# Patient Record
Sex: Female | Born: 1935 | Race: White | Hispanic: No | Smoking: Former smoker
Health system: Southern US, Community
[De-identification: ages and names within clinical notes are randomized; demographics above are authoritative.]

## PROBLEM LIST (undated history)

## (undated) DIAGNOSIS — F329 Major depressive disorder, single episode, unspecified: Secondary | ICD-10-CM

## (undated) DIAGNOSIS — F32A Depression, unspecified: Secondary | ICD-10-CM

## (undated) DIAGNOSIS — R011 Cardiac murmur, unspecified: Secondary | ICD-10-CM

## (undated) HISTORY — PX: ABDOMINAL HYSTERECTOMY: SHX81

## (undated) HISTORY — DX: Major depressive disorder, single episode, unspecified: F32.9

## (undated) HISTORY — DX: Cardiac murmur, unspecified: R01.1

## (undated) HISTORY — DX: Depression, unspecified: F32.A

## (undated) HISTORY — PX: APPENDECTOMY: SHX54

---

## 2009-01-18 ENCOUNTER — Encounter: Admission: RE | Admit: 2009-01-18 | Discharge: 2009-01-18 | Payer: Self-pay | Admitting: General Practice

## 2011-03-12 ENCOUNTER — Encounter: Payer: Self-pay | Admitting: Family Medicine

## 2013-04-18 ENCOUNTER — Ambulatory Visit: Payer: Medicare Other

## 2013-04-18 ENCOUNTER — Ambulatory Visit (INDEPENDENT_AMBULATORY_CARE_PROVIDER_SITE_OTHER): Payer: Medicare Other | Admitting: Family Medicine

## 2013-04-18 VITALS — BP 158/88 | HR 103 | Temp 99.0°F | Resp 16 | Ht 63.0 in | Wt 218.0 lb

## 2013-04-18 DIAGNOSIS — M25519 Pain in unspecified shoulder: Secondary | ICD-10-CM

## 2013-04-18 DIAGNOSIS — M19011 Primary osteoarthritis, right shoulder: Secondary | ICD-10-CM

## 2013-04-18 DIAGNOSIS — M19019 Primary osteoarthritis, unspecified shoulder: Secondary | ICD-10-CM

## 2013-04-18 DIAGNOSIS — M25511 Pain in right shoulder: Secondary | ICD-10-CM

## 2013-04-18 MED ORDER — DICLOFENAC SODIUM 50 MG PO TBEC: 50.0000 mg | DELAYED_RELEASE_TABLET | Freq: Two times a day (BID) | ORAL | Status: DC

## 2013-04-18 NOTE — Progress Notes (Signed)
Subjective: 79 year old lady who lives here in [place] with her son. She moved here about 5 years ago from [place]. She used to play the piano and other musical instruments. She has been hurting in her right shoulder for a couple of years, but more recently. Over the last couple of months she has been taking a couple of aspirin for it. Today he just seemed like a convenient time for them to come in and get it checked, nothing acutely worse today but it has just been progressively worse.  Objective: Pleasant elderly lady in no major distress. Range of motion of the right shoulder is a little diminished as compared to the left. She is left-handed. She has very adequate strength and get her grip in both hands. Sensory grossly normal. Neck has satisfactory range of motion and does not seem to be a source of her complaints and problems.  Assessment: Right shoulder pain  Plan: X-ray right shoulder  [facility] reading (PRIMARY) by  Dr. Alwyn Ren djd shoulder  Discussed cortisone and nsaids and referral to ortho.  Decided to try nsaid rx for 10 days.  If symptoms persist return for injection.  Stop meds if any side effects.Marland Kitchen

## 2013-04-18 NOTE — Patient Instructions (Signed)
Take the diclofenac one twice daily with food. Do not exceed 10 days. Stop immediately if it causes stomach pain or ankle swelling  If not improving or pains or continued to persist over the next month return and I can inject some cortisone.  Return sooner if concerned

## 2013-05-04 ENCOUNTER — Ambulatory Visit: Payer: Medicare Other | Admitting: Family Medicine

## 2013-05-18 ENCOUNTER — Ambulatory Visit (INDEPENDENT_AMBULATORY_CARE_PROVIDER_SITE_OTHER): Payer: Medicare Other | Admitting: Family Medicine

## 2013-05-18 ENCOUNTER — Encounter: Payer: Self-pay | Admitting: Family Medicine

## 2013-05-18 VITALS — BP 160/90 | HR 78 | Temp 97.6°F | Resp 18 | Ht 61.0 in | Wt 213.0 lb

## 2013-05-18 DIAGNOSIS — I10 Essential (primary) hypertension: Secondary | ICD-10-CM

## 2013-05-18 DIAGNOSIS — F0390 Unspecified dementia without behavioral disturbance: Secondary | ICD-10-CM | POA: Insufficient documentation

## 2013-05-18 DIAGNOSIS — M19011 Primary osteoarthritis, right shoulder: Secondary | ICD-10-CM

## 2013-05-18 DIAGNOSIS — E119 Type 2 diabetes mellitus without complications: Secondary | ICD-10-CM

## 2013-05-18 DIAGNOSIS — Z0000 Encounter for general adult medical examination without abnormal findings: Secondary | ICD-10-CM

## 2013-05-18 DIAGNOSIS — L989 Disorder of the skin and subcutaneous tissue, unspecified: Secondary | ICD-10-CM

## 2013-05-18 DIAGNOSIS — E785 Hyperlipidemia, unspecified: Secondary | ICD-10-CM

## 2013-05-18 LAB — COMPREHENSIVE METABOLIC PANEL
ALK PHOS: 92 U/L (ref 39–117)
ALT: 17 U/L (ref 0–35)
AST: 19 U/L (ref 0–37)
Albumin: 4.4 g/dL (ref 3.5–5.2)
BILIRUBIN TOTAL: 0.7 mg/dL (ref 0.3–1.2)
BUN: 12 mg/dL (ref 6–23)
CO2: 25 mEq/L (ref 19–32)
Calcium: 10.1 mg/dL (ref 8.4–10.5)
Chloride: 101 mEq/L (ref 96–112)
Creat: 0.61 mg/dL (ref 0.50–1.10)
Glucose, Bld: 137 mg/dL — ABNORMAL HIGH (ref 70–99)
Potassium: 4 mEq/L (ref 3.5–5.3)
SODIUM: 136 meq/L (ref 135–145)
TOTAL PROTEIN: 7.6 g/dL (ref 6.0–8.3)

## 2013-05-18 LAB — CBC
HCT: 43.3 % (ref 36.0–46.0)
Hemoglobin: 14.8 g/dL (ref 12.0–15.0)
MCH: 30.6 pg (ref 26.0–34.0)
MCHC: 34.2 g/dL (ref 30.0–36.0)
MCV: 89.5 fL (ref 78.0–100.0)
PLATELETS: 175 10*3/uL (ref 150–400)
RBC: 4.84 MIL/uL (ref 3.87–5.11)
RDW: 13.7 % (ref 11.5–15.5)
WBC: 9.8 10*3/uL (ref 4.0–10.5)

## 2013-05-18 LAB — TSH: TSH: 1.326 u[IU]/mL (ref 0.350–4.500)

## 2013-05-18 LAB — LIPID PANEL
CHOL/HDL RATIO: 4.2 ratio
Cholesterol: 249 mg/dL — ABNORMAL HIGH (ref 0–200)
HDL: 59 mg/dL (ref >39)
LDL Cholesterol: 164 mg/dL — ABNORMAL HIGH (ref 0–99)
Triglycerides: 131 mg/dL (ref <150)
VLDL: 26 mg/dL (ref 0–40)

## 2013-05-18 MED ORDER — AMLODIPINE BESYLATE 2.5 MG PO TABS: 2.5000 mg | ORAL_TABLET | Freq: Every day | ORAL | Status: DC

## 2013-05-18 NOTE — Patient Instructions (Signed)
Start taking the amlodipine 2.5 mg once a day. If you can, please check your BP at home a couple of times a week and give me an update in a few weeks.   I will be in touch regarding your labs- please set up your mychart account if you can.   You might want to consider having a mammogram in the next 6 months or so.  You might try the [facility]  Address: [address] [place], [place] [address]  [phone]  If we could, it would be helpful to get your old medical records so I can make sure you have had all of your immunizations (pneumonia, shingles, etc).   After bathing you should carefully dry under your breasts and under your belly.  Try using some powder to keep this area dry.  If you do decide you would like to have someone help you bathe at home I am glad to help arrange this.   I will arrange a dermatology appt to look at the area on your leg.

## 2013-05-18 NOTE — Progress Notes (Addendum)
[facility] [address] [place] [address] [phone]  Date:  05/18/2013   Name:  Anne Rice   DOB:  1924/10/14   MRN:  [id number]  PCP:  No primary provider on file.    Chief Complaint: Annual Exam   History of Present Illness:  Anne Rice is a 80 y.o. very pleasant female patient who presents with the following:  Here today for a CPE.  Seen here once in December for shoulder pain.  She has been noted to have elevated BP the last time she was seen here.   She is from [place], but now lives here wiht her son Anne Rice.  Moved in 2012  Her most recent mammogram was in 2010.   History of recurrent cerumen impaction.  She has been taking diclofenac for her shoulder arthritis; this helps somewhat She is fasting today for labs.   She has noted a skin lesion on her right shin for about 4 years.  Admits that she does tend to pick at it, but it is not aware of it getting bigger. This has not been bx.   She had been getting regular medical care prior to her move but since then has not been seeing a doctor regularly   Her son is aware that she is demented but has not noted any significant change in recent months.  He does not yet feel that her memory problems are causing any problems for her care at home an does not yet have any in- home assistance for her.    There are no active problems to display for this patient.   Past Medical History  Diagnosis Date  . Depression     Past Surgical History  Procedure Laterality Date  . Appendectomy    . Abdominal hysterectomy      History  Substance Use Topics  . Smoking status: Never Smoker   . Smokeless tobacco: Not on file  . Alcohol Use: No    No family history on file.  No Known Allergies  Medication list has been reviewed and updated.  Current Outpatient Prescriptions on File Prior to Visit  Medication Sig Dispense Refill  . diclofenac (VOLTAREN) 50 MG EC tablet Take 1 tablet (50 mg total) by  mouth 2 (two) times daily. Take with food.  20 tablet  0  . aspirin EC 81 MG tablet Take 81 mg by mouth daily. Pt taking 2 tablets daily       No current facility-administered medications on file prior to visit.    Review of Systems:  As per HPI- otherwise negative.   Physical Examination: Filed Vitals:   05/18/13 0940  BP: 172/90  Pulse: 78  Temp: 97.6 F (36.4 C)  Resp: 18   Filed Vitals:   05/18/13 0940  Height: 5\' 1"  (1.549 m)  Weight: 213 lb (96.616 kg)   Body mass index is 40.27 kg/(m^2). Ideal Body Weight: Weight in (lb) to have BMI = 25: 132  GEN: WDWN, NAD, Non-toxic, Alert and pleasant.  Tangential speech with multiple off topic stories and comments.  Pleasant.  HEENT: Atraumatic, Normocephalic. Neck supple. No masses, No LAD. Ears and Nose: No external deformity. CV: RRR, No M/G/R. No JVD. No thrill. No extra heart sounds. PULM: CTA B, no wheezes, crackles, rhonchi. No retractions. No resp. distress. No accessory muscle use. ABD: S, NT, ND, +BS. No rebound. No HSM. EXTR: No c/c/e NEURO Normal gait.  PSYCH: Normally interactive. Conversant. Not  depressed or anxious appearing.  Calm demeanor.  Right shin: there is a 2 x 2.5 cm lesion on the right shin.  This appears ulcerated and chronically picked.   Breast exam: no apparent mass.  She does have evidence of yeast infection/ foul odor from skin folds under her breasts and pannus, and urine odor in her groin area   Assessment and Plan: Physical exam - Plan: CBC, Comprehensive metabolic panel, TSH, Lipid panel  Skin lesion of right leg - Plan: Ambulatory referral to Dermatology  HTN (hypertension) - Plan: amLODipine (NORVASC) 2.5 MG tablet  Check labs as above.   Discussed with pt and her son.  As of now she declines any bathing help.  Encouraged a shower at least every 2 or 3 days, pat dry skin and use powder to prevent dampness  Start amlodipine 2.5 mg once a day for her BP Referral to derm for her skin  lesion Dementia: thus far not causing problems at home.  Her son is aware but not bothered by this so far.    Signed Lamar Blinks, MD  11/27 Called and discussed with her son Anne Rice.  Unfortunately it does look like Anne Rice has mild DM.  Placed an order for an A1c for her; they will come by and do this as a lab visit only soon.  Also she does have high cholesterol.  Encouraged them to look at the ADA website for lots of good information. They will work on diet and he will try to encourage her to exercise as much as is possible for her.   Cholesterol is also high especially in light of her diabetes.  Will start pravachol 10 mg daily  She would like a rf of her voltaren as well; will do this for her.  Encouraged caution with this medication due to risk of stomach irritation

## 2013-05-19 ENCOUNTER — Encounter: Payer: Self-pay | Admitting: Family Medicine

## 2013-05-19 MED ORDER — PRAVASTATIN SODIUM 10 MG PO TABS: 10.0000 mg | ORAL_TABLET | Freq: Every day | ORAL | Status: DC

## 2013-05-19 MED ORDER — DICLOFENAC SODIUM 50 MG PO TBEC: 50.0000 mg | DELAYED_RELEASE_TABLET | Freq: Two times a day (BID) | ORAL | Status: DC

## 2013-05-19 NOTE — Addendum Note (Signed)
Addended by: Darreld Mclean on: 05/19/2013 08:33 PM   Modules accepted: Orders

## 2013-05-20 ENCOUNTER — Telehealth: Payer: Self-pay | Admitting: Family Medicine

## 2013-05-20 ENCOUNTER — Other Ambulatory Visit (INDEPENDENT_AMBULATORY_CARE_PROVIDER_SITE_OTHER): Payer: Medicare Other | Admitting: *Deleted

## 2013-05-20 DIAGNOSIS — E119 Type 2 diabetes mellitus without complications: Secondary | ICD-10-CM

## 2013-05-20 LAB — POCT GLYCOSYLATED HEMOGLOBIN (HGB A1C): HEMOGLOBIN A1C: 6

## 2013-05-20 NOTE — Telephone Encounter (Signed)
Called her son and discussed her A1c.  It is 6% which is in the pre- diabetic range.  At this time we will continue working on diet and exercise.  Check back in 3 months or so

## 2013-09-04 ENCOUNTER — Encounter: Payer: Self-pay | Admitting: Family Medicine

## 2013-09-04 DIAGNOSIS — C4491 Basal cell carcinoma of skin, unspecified: Secondary | ICD-10-CM | POA: Insufficient documentation

## 2013-09-30 ENCOUNTER — Other Ambulatory Visit: Payer: Self-pay | Admitting: Family Medicine

## 2013-10-13 ENCOUNTER — Other Ambulatory Visit: Payer: Self-pay | Admitting: Family Medicine

## 2013-10-24 ENCOUNTER — Other Ambulatory Visit: Payer: Self-pay | Admitting: Family Medicine

## 2013-12-05 ENCOUNTER — Other Ambulatory Visit: Payer: Self-pay | Admitting: Physician Assistant

## 2013-12-05 ENCOUNTER — Other Ambulatory Visit: Payer: Self-pay | Admitting: Family Medicine

## 2013-12-19 ENCOUNTER — Other Ambulatory Visit: Payer: Self-pay | Admitting: Physician Assistant

## 2013-12-29 ENCOUNTER — Telehealth: Payer: Self-pay

## 2013-12-29 DIAGNOSIS — E785 Hyperlipidemia, unspecified: Secondary | ICD-10-CM

## 2013-12-29 MED ORDER — AMLODIPINE BESYLATE 2.5 MG PO TABS: 2.5000 mg | ORAL_TABLET | Freq: Every day | ORAL | Status: DC

## 2013-12-29 MED ORDER — PRAVASTATIN SODIUM 10 MG PO TABS: 10.0000 mg | ORAL_TABLET | Freq: Every day | ORAL | Status: DC

## 2013-12-29 MED ORDER — DICLOFENAC SODIUM 50 MG PO TBEC: 50.0000 mg | DELAYED_RELEASE_TABLET | Freq: Every day | ORAL | Status: DC

## 2013-12-29 NOTE — Telephone Encounter (Signed)
Pt has called and left message over wknd needing her medicine that has ran out.   I have left a message to call and make an appt.  She wants to know if she can get a refill on   Amlodipine (Norvasc) Diclofenac (Voltaren)   PT # [phone]

## 2013-12-29 NOTE — Telephone Encounter (Signed)
Called son back and discussed what appt is needed for, and also explained why we send messages on refills first and then would follow up with a phone call if pt does not get in for OV. Discussed MyChart and it being a more direct form of communication with the provider if needed. Son stated he will sign up after pt's appt. I advised I will send in 1 mos of RFs on all three meds pt needs to hold her over until appt. Done.

## 2013-12-29 NOTE — Telephone Encounter (Signed)
Pt has received 2 notifications she needs an OV- Please advise refills

## 2014-01-11 ENCOUNTER — Encounter: Payer: Self-pay | Admitting: Family Medicine

## 2014-01-11 ENCOUNTER — Other Ambulatory Visit: Payer: Self-pay | Admitting: Family Medicine

## 2014-01-11 ENCOUNTER — Ambulatory Visit (INDEPENDENT_AMBULATORY_CARE_PROVIDER_SITE_OTHER): Payer: Medicare Other | Admitting: Family Medicine

## 2014-01-11 VITALS — BP 142/90 | HR 93 | Temp 97.9°F | Resp 16 | Ht 62.0 in | Wt 210.0 lb

## 2014-01-11 DIAGNOSIS — Z23 Encounter for immunization: Secondary | ICD-10-CM

## 2014-01-11 DIAGNOSIS — M19011 Primary osteoarthritis, right shoulder: Secondary | ICD-10-CM

## 2014-01-11 DIAGNOSIS — E785 Hyperlipidemia, unspecified: Secondary | ICD-10-CM

## 2014-01-11 DIAGNOSIS — I10 Essential (primary) hypertension: Secondary | ICD-10-CM

## 2014-01-11 DIAGNOSIS — Z1239 Encounter for other screening for malignant neoplasm of breast: Secondary | ICD-10-CM

## 2014-01-11 DIAGNOSIS — E669 Obesity, unspecified: Secondary | ICD-10-CM

## 2014-01-11 DIAGNOSIS — M19019 Primary osteoarthritis, unspecified shoulder: Secondary | ICD-10-CM

## 2014-01-11 DIAGNOSIS — E119 Type 2 diabetes mellitus without complications: Secondary | ICD-10-CM

## 2014-01-11 LAB — CBC
HCT: 42.7 % (ref 36.0–46.0)
Hemoglobin: 14.2 g/dL (ref 12.0–15.0)
MCH: 30.5 pg (ref 26.0–34.0)
MCHC: 33.3 g/dL (ref 30.0–36.0)
MCV: 91.6 fL (ref 78.0–100.0)
PLATELETS: 185 10*3/uL (ref 150–400)
RBC: 4.66 MIL/uL (ref 3.87–5.11)
RDW: 13.8 % (ref 11.5–15.5)
WBC: 10.5 10*3/uL (ref 4.0–10.5)

## 2014-01-11 NOTE — Progress Notes (Signed)
[facility] [address] [place] [address] [phone]  Date:  01/11/2014   Name:  Anne Rice   DOB:  April 01, 1925   MRN:  [id number]  PCP:  No primary provider on file.    Chief Complaint: bloodwork and Medication Refill   History of Present Illness:  Anne Rice is a 80 y.o. very pleasant female patient who presents with the following:  Here today to follow-up. Seen in January and noted to have diabetes by her FBG of 137, A1c looked good.  She is doing well today.  She is fasting this am.  Here today with her son Flu shot today She saw Dr. Earley Brooke for the basal cell on her leg and this is doing well.  She had this excised  She has had some trouble with her right arm for about a year or so- she was noted to have significant OA in the shoulder on plain film a few months ago.  The shoulder does give her some pain, and interfered with her ADLs  Lab Results  Component Value Date   HGBA1C 6.0 05/20/2013     Patient Active Problem List   Diagnosis Date Noted  . Basal cell carcinoma of skin 09/04/2013  . HTN (hypertension) 05/18/2013  . Dementia 05/18/2013    Past Medical History  Diagnosis Date  . Depression     Past Surgical History  Procedure Laterality Date  . Appendectomy    . Abdominal hysterectomy      History  Substance Use Topics  . Smoking status: Never Smoker   . Smokeless tobacco: Not on file  . Alcohol Use: No    No family history on file.  No Known Allergies  Medication list has been reviewed and updated.  Current Outpatient Prescriptions on File Prior to Visit  Medication Sig Dispense Refill  . amLODipine (NORVASC) 2.5 MG tablet Take 1 tablet (2.5 mg total) by mouth daily.  30 tablet  0  . aspirin EC 81 MG tablet Take 81 mg by mouth daily. Pt taking 2 tablets daily      . diclofenac (VOLTAREN) 50 MG EC tablet Take 1 tablet (50 mg total) by mouth daily.  30 tablet  0  . pravastatin (PRAVACHOL) 10 MG tablet Take  1 tablet (10 mg total) by mouth daily.  30 tablet  0   No current facility-administered medications on file prior to visit.    Review of Systems:  As per HPI- otherwise negative.   Physical Examination: Filed Vitals:   01/11/14 1129  BP: 164/90  Pulse: 93  Temp: 97.9 F (36.6 C)  Resp: 16   Filed Vitals:   01/11/14 1129  Height: 5\' 2"  (1.575 m)  Weight: 210 lb (95.255 kg)   Body mass index is 38.4 kg/(m^2). Ideal Body Weight: Weight in (lb) to have BMI = 25: 136.4  GEN: WDWN, NAD, Non-toxic, A & O x 3, obese, looks well HEENT: Atraumatic, Normocephalic. Neck supple. No masses, No LAD. Ears and Nose: No external deformity. CV: RRR, No M/G/R. No JVD. No thrill. No extra heart sounds. PULM: CTA B, no wheezes, crackles, rhonchi. No retractions. No resp. distress. No accessory muscle use. ABD: S, NT, ND EXTR: No c/c/e NEURO Normal gait.  PSYCH: Normally interactive. Conversant. Not depressed or anxious appearing.  Calm demeanor.  Right shoulder: she has fairly good ROM but pain with internal rotation, and not quite full extension or abduction.     Assessment  and Plan: Immunization due - Plan: Pneumococcal conjugate vaccine 13-valent IM, Flu Vaccine QUAD 36+ mos IM  Type II or unspecified type diabetes mellitus without mention of complication, not stated as uncontrolled - Plan: Hemoglobin A1c  Other and unspecified hyperlipidemia - Plan: Lipid panel  Essential hypertension - Plan: CBC, Comprehensive metabolic panel  Primary osteoarthritis of right shoulder - Plan: Ambulatory referral to Orthopedic Surgery  Breast cancer screening - Plan: MM Digital Screening  prevnar and flu shots today Await other labs.  She would like to see ortho for her shoulder- given CD of her films and will arrange appt for her Referral for mammogram as well HTN is under reasonable control   Signed Lamar Blinks, MD

## 2014-01-11 NOTE — Patient Instructions (Signed)
Good to see you today!  I will set up a referral for you to see an orthopedist about your shoulder and also will arrange a mammogram for you.  Otherwise I will be in touch with your labs asap

## 2014-01-12 LAB — COMPREHENSIVE METABOLIC PANEL
ALK PHOS: 74 U/L (ref 39–117)
ALT: 25 U/L (ref 0–35)
AST: 23 U/L (ref 0–37)
Albumin: 4.3 g/dL (ref 3.5–5.2)
BUN: 20 mg/dL (ref 6–23)
CALCIUM: 9.9 mg/dL (ref 8.4–10.5)
CHLORIDE: 102 meq/L (ref 96–112)
CO2: 25 mEq/L (ref 19–32)
CREATININE: 0.78 mg/dL (ref 0.50–1.10)
Glucose, Bld: 114 mg/dL — ABNORMAL HIGH (ref 70–99)
POTASSIUM: 4.8 meq/L (ref 3.5–5.3)
Sodium: 137 mEq/L (ref 135–145)
Total Bilirubin: 0.7 mg/dL (ref 0.2–1.2)
Total Protein: 7.2 g/dL (ref 6.0–8.3)

## 2014-01-12 LAB — HEMOGLOBIN A1C
HEMOGLOBIN A1C: 6.8 % — AB (ref <5.7)
MEAN PLASMA GLUCOSE: 148 mg/dL — AB (ref <117)

## 2014-01-12 LAB — LIPID PANEL
CHOL/HDL RATIO: 3.8 ratio
Cholesterol: 229 mg/dL — ABNORMAL HIGH (ref 0–200)
HDL: 61 mg/dL (ref >39)
LDL CALC: 133 mg/dL — AB (ref 0–99)
Triglycerides: 175 mg/dL — ABNORMAL HIGH (ref <150)
VLDL: 35 mg/dL (ref 0–40)

## 2014-01-13 ENCOUNTER — Encounter: Payer: Self-pay | Admitting: Family Medicine

## 2014-01-21 ENCOUNTER — Ambulatory Visit
Admission: RE | Admit: 2014-01-21 | Discharge: 2014-01-21 | Disposition: A | Discharge status: Home / Self Care | Payer: Medicare Other | Source: Ambulatory Visit | Attending: Family Medicine | Admitting: Family Medicine

## 2014-01-21 DIAGNOSIS — Z1239 Encounter for other screening for malignant neoplasm of breast: Secondary | ICD-10-CM

## 2014-01-27 ENCOUNTER — Other Ambulatory Visit: Payer: Self-pay | Admitting: Family Medicine

## 2014-01-27 DIAGNOSIS — E785 Hyperlipidemia, unspecified: Secondary | ICD-10-CM

## 2014-01-27 MED ORDER — PRAVASTATIN SODIUM 10 MG PO TABS: 10.0000 mg | ORAL_TABLET | Freq: Every day | ORAL | Status: DC

## 2014-01-27 NOTE — Telephone Encounter (Signed)
Pt's son called checking status of RFs he thought were going to be sent in at 01/11/14 OV. Reviewed OV notes and sent in RFs.

## 2014-03-23 ENCOUNTER — Telehealth: Payer: Self-pay

## 2014-03-23 NOTE — Telephone Encounter (Signed)
Pt needs to come into the office- transferred son to scheduling.

## 2014-03-23 NOTE — Telephone Encounter (Signed)
Pts son states mom needs flexibility to take more than one of her Diclofenac,please increase rx???   Best phone for son is [phone]   Pharmacy [address]

## 2014-04-19 ENCOUNTER — Ambulatory Visit (INDEPENDENT_AMBULATORY_CARE_PROVIDER_SITE_OTHER): Payer: Medicare Other | Admitting: Family Medicine

## 2014-04-19 ENCOUNTER — Encounter: Payer: Self-pay | Admitting: Family Medicine

## 2014-04-19 VITALS — BP 146/84 | HR 94 | Temp 98.0°F | Resp 16 | Ht 62.0 in | Wt 208.0 lb

## 2014-04-19 DIAGNOSIS — M19011 Primary osteoarthritis, right shoulder: Secondary | ICD-10-CM

## 2014-04-19 DIAGNOSIS — R739 Hyperglycemia, unspecified: Secondary | ICD-10-CM

## 2014-04-19 LAB — MICROALBUMIN, URINE: MICROALB UR: 0.5 mg/dL (ref <2.0)

## 2014-04-19 LAB — POCT GLYCOSYLATED HEMOGLOBIN (HGB A1C): HEMOGLOBIN A1C: 6.3

## 2014-04-19 LAB — GLUCOSE, POCT (MANUAL RESULT ENTRY): POC Glucose: 130 mg/dl — AB (ref 70–99)

## 2014-04-19 MED ORDER — DICLOFENAC SODIUM 1 % TD GEL: 2.0000 g | Freq: Four times a day (QID) | TRANSDERMAL | Status: DC

## 2014-04-19 NOTE — Progress Notes (Signed)
Examined pt with Lindaann Slough, PA-C and agree.  They have been to see Dr. Tamera Punt, a shoulder specialist a few months ago.  Per son's report they discussed surgery/? replacement but they were hesitant to pursue surgery at that time.  Uncertain if they talked about possible injection.  I will give Dr. Tamera Punt a call and clarify if he thinks an injection will be helpful.  Hold off an another referral while I get in touch with him

## 2014-04-19 NOTE — Progress Notes (Signed)
   Subjective:    Patient ID: Anne Rice, female    DOB: 1925-01-22, 80 y.o.   MRN: [id number]  HPI Patient presents with son for adjustment of diclofenac for right shoulder pain that has been present for over a year. Pain is not worse, but is constant. Takes medication in the morning and pain returns within hours. Pain sometimes radiates to neck or arm, but currently. Some loss of function due to pain, however, no numbness of fingers or hands. Not interested in PT for mother at this time.  BP elevated in office. Son states that [facility] is usually in the 140s, but is always increased when in office.    Review of Systems  Constitutional: Positive for activity change.  Respiratory: Negative for shortness of breath.   Cardiovascular: Negative for chest pain and palpitations.  Musculoskeletal: Positive for myalgias and arthralgias. Negative for back pain, joint swelling, gait problem, neck pain and neck stiffness.  Neurological: Negative for light-headedness and headaches.       Objective:   Physical Exam  Constitutional: She appears well-developed and well-nourished. No distress.  Blood pressure 163/82, pulse 94, temperature 98 F (36.7 C), temperature source Oral, resp. rate 16, height 5\' 2"  (1.575 m), weight 208 lb (94.348 kg), SpO2 95 %. BP recheck-146/84  HENT:  Head: Normocephalic and atraumatic.  Right Ear: External ear normal.  Left Ear: External ear normal.  Eyes: Conjunctivae are normal. Pupils are equal, round, and reactive to light. Right eye exhibits no discharge. Left eye exhibits no discharge. No scleral icterus.  Cardiovascular: Normal rate, regular rhythm and normal heart sounds.   Pulmonary/Chest: Effort normal and breath sounds normal. She has no wheezes. She has no rales.  Musculoskeletal: She exhibits no edema or tenderness.       Right shoulder: She exhibits decreased range of motion, pain and decreased strength. She exhibits no tenderness, no  swelling, no effusion, no deformity and no spasm.       Right elbow: Normal.      Right wrist: Normal.  Neurological: She is alert. She has normal reflexes. She displays no atrophy. No sensory deficit. She exhibits normal muscle tone. Coordination abnormal.  Limited to patient's listening deficits.  Skin: Skin is warm and dry. No rash noted. She is not diaphoretic. No erythema. No pallor.   Results for orders placed or performed in visit on 04/19/14  POCT glucose (manual entry)  Result Value Ref Range   POC Glucose 130 (A) 70 - 99 mg/dl  POCT glycosylated hemoglobin (Hb A1C)  Result Value Ref Range   Hemoglobin A1C 6.3        Assessment & Plan:  1. Hyperglycemia Controlled with diet. HA1C within range. - Microalbumin, urine - POCT glucose (manual entry) - POCT glycosylated hemoglobin (Hb A1C)  2. Primary osteoarthritis of right shoulder - Continue diclofenac 50 mg po.  - diclofenac sodium (VOLTAREN) 1 % GEL; Apply 2 g topically 4 (four) times daily.  Dispense: 1 Tube; Refill: 5 - Ambulatory referral to [place] PA-C  [facility] 04/19/2014 9:02 AM

## 2014-04-20 ENCOUNTER — Telehealth: Payer: Self-pay

## 2014-04-20 NOTE — Telephone Encounter (Signed)
The cream prescribed is not covered by insurance.  Patient wants something covered with insurance.   [facility] [place] [facility] [address]   [phone]

## 2014-04-21 NOTE — Telephone Encounter (Signed)
Received fax from pharm that voltaren gel is not covered. Called son who reported that pt used to take diclofenac tablets BID, but new Rx if for once daily and provider wanted pt to use the topical if more is needed. Pt has also tried/failed ibuprofen and aspirin. Completed PA on covermymeds. Pending.

## 2014-04-22 NOTE — Telephone Encounter (Signed)
PA approved through 04/22/15. Notified son and pharmacy.

## 2014-04-26 ENCOUNTER — Telehealth: Payer: Self-pay | Admitting: Family Medicine

## 2014-04-26 NOTE — Telephone Encounter (Signed)
Called and spoke with her son Anne Rice.  I received a VM form Dr. Bettina Gavia PA today- they would certainly be willing to try an injection.  At her last visit the impression had been that her shoulder was not bothering her to the point where she wanted an injection.  It can be hard to judge Anne Rice's level of discomfort as she does have some dementia.  Anne Rice will call and schedule a recheck with Dr. Tamera Punt for possible shoulder injection

## 2014-04-28 DIAGNOSIS — M19011 Primary osteoarthritis, right shoulder: Secondary | ICD-10-CM | POA: Diagnosis not present

## 2014-07-17 ENCOUNTER — Other Ambulatory Visit: Payer: Self-pay | Admitting: Family Medicine

## 2014-08-04 ENCOUNTER — Telehealth: Payer: Self-pay | Admitting: Family Medicine

## 2014-08-04 NOTE — Telephone Encounter (Signed)
lmom of below recommendations.

## 2014-08-04 NOTE — Telephone Encounter (Signed)
Please give her son a call.  I would recommend Margot Ables, [facility] opthalmology or Haskell Flirt unless her eye doctor suggests someone else.

## 2014-08-04 NOTE — Telephone Encounter (Signed)
Patient's son states that his mother needs to have cataract surgery and they need a recommendation of where to go have that done. Please call Huma Imhoff at [phone].

## 2014-08-16 ENCOUNTER — Other Ambulatory Visit: Payer: Self-pay

## 2014-08-16 DIAGNOSIS — E785 Hyperlipidemia, unspecified: Secondary | ICD-10-CM

## 2014-08-16 MED ORDER — DICLOFENAC SODIUM 50 MG PO TBEC: 50.0000 mg | DELAYED_RELEASE_TABLET | Freq: Every day | ORAL | Status: DC

## 2014-08-16 MED ORDER — AMLODIPINE BESYLATE 2.5 MG PO TABS: 2.5000 mg | ORAL_TABLET | Freq: Every day | ORAL | Status: DC

## 2014-08-16 MED ORDER — PRAVASTATIN SODIUM 10 MG PO TABS: 10.0000 mg | ORAL_TABLET | Freq: Every day | ORAL | Status: DC

## 2014-08-25 DIAGNOSIS — H2513 Age-related nuclear cataract, bilateral: Secondary | ICD-10-CM | POA: Diagnosis not present

## 2014-09-16 DIAGNOSIS — H25811 Combined forms of age-related cataract, right eye: Secondary | ICD-10-CM | POA: Diagnosis not present

## 2014-09-16 DIAGNOSIS — H2511 Age-related nuclear cataract, right eye: Secondary | ICD-10-CM | POA: Diagnosis not present

## 2014-10-14 DIAGNOSIS — H25812 Combined forms of age-related cataract, left eye: Secondary | ICD-10-CM | POA: Diagnosis not present

## 2014-10-14 DIAGNOSIS — H268 Other specified cataract: Secondary | ICD-10-CM | POA: Diagnosis not present

## 2014-10-14 DIAGNOSIS — H21562 Pupillary abnormality, left eye: Secondary | ICD-10-CM | POA: Diagnosis not present

## 2014-10-14 DIAGNOSIS — H2512 Age-related nuclear cataract, left eye: Secondary | ICD-10-CM | POA: Diagnosis not present

## 2014-10-18 ENCOUNTER — Encounter: Payer: Self-pay | Admitting: Family Medicine

## 2014-10-18 ENCOUNTER — Ambulatory Visit (INDEPENDENT_AMBULATORY_CARE_PROVIDER_SITE_OTHER): Payer: Medicare Other | Admitting: Family Medicine

## 2014-10-18 VITALS — BP 122/84 | HR 69 | Temp 97.8°F | Resp 18 | Ht 63.0 in | Wt 207.2 lb

## 2014-10-18 DIAGNOSIS — E785 Hyperlipidemia, unspecified: Secondary | ICD-10-CM | POA: Diagnosis not present

## 2014-10-18 DIAGNOSIS — I10 Essential (primary) hypertension: Secondary | ICD-10-CM

## 2014-10-18 DIAGNOSIS — Z5181 Encounter for therapeutic drug level monitoring: Secondary | ICD-10-CM | POA: Diagnosis not present

## 2014-10-18 DIAGNOSIS — E119 Type 2 diabetes mellitus without complications: Secondary | ICD-10-CM | POA: Diagnosis not present

## 2014-10-18 LAB — HEMOGLOBIN A1C
Hgb A1c MFr Bld: 6.3 % — ABNORMAL HIGH (ref <5.7)
MEAN PLASMA GLUCOSE: 134 mg/dL — AB (ref <117)

## 2014-10-18 LAB — LIPID PANEL
Cholesterol: 211 mg/dL — ABNORMAL HIGH (ref 0–200)
HDL: 62 mg/dL (ref >=46)
LDL Cholesterol: 123 mg/dL — ABNORMAL HIGH (ref 0–99)
Total CHOL/HDL Ratio: 3.4 Ratio
Triglycerides: 130 mg/dL (ref <150)
VLDL: 26 mg/dL (ref 0–40)

## 2014-10-18 LAB — COMPREHENSIVE METABOLIC PANEL
ALT: 21 U/L (ref 0–35)
AST: 20 U/L (ref 0–37)
Albumin: 4.1 g/dL (ref 3.5–5.2)
Alkaline Phosphatase: 81 U/L (ref 39–117)
BILIRUBIN TOTAL: 0.6 mg/dL (ref 0.2–1.2)
BUN: 18 mg/dL (ref 6–23)
CO2: 27 mEq/L (ref 19–32)
CREATININE: 0.72 mg/dL (ref 0.50–1.10)
Calcium: 10.2 mg/dL (ref 8.4–10.5)
Chloride: 103 mEq/L (ref 96–112)
GLUCOSE: 116 mg/dL — AB (ref 70–99)
Potassium: 4.9 mEq/L (ref 3.5–5.3)
Sodium: 139 mEq/L (ref 135–145)
Total Protein: 7.1 g/dL (ref 6.0–8.3)

## 2014-10-18 MED ORDER — AMLODIPINE BESYLATE 2.5 MG PO TABS: 2.5000 mg | ORAL_TABLET | Freq: Every day | ORAL | Status: DC

## 2014-10-18 MED ORDER — PRAVASTATIN SODIUM 10 MG PO TABS: 10.0000 mg | ORAL_TABLET | Freq: Every day | ORAL | Status: DC

## 2014-10-18 NOTE — Patient Instructions (Signed)
Great to see you today!  Continue taking your cholesterol and BP medications  I will be in touch with your labs You may get the zostavax (shingles vaccine) at your convenience.   This is a one time shot, and is carried by many major drug stores.  The pharmacist can give you the shot Assuming all looks ok with your labs let's plan to recheck in 6 months again  Don't forget to get your flu shot this fall!  We can do this for you here, or you can get it at your drug store  As a diabetic, there are several things you can do to monitor your condition and maintain your health.  1. Check your feet daily for any skin breakdown 2. Exercise and keep track of your diet 3. Let us know before you run out of your medications 4. Get your annual flu shot, and ask if you need a pneumonia shot 5. Ask if you are up to date on your labs; you should have an A1c every 6 months, a urine protein test annually, and a cholesterol test annually.  Your doctor may decide to do labs more often if indicated 6. Take off your shoes and socks at each visit.  Be sure your doctor examines your feet.   7. Ask about your blood pressure.  Your goal is 130/ 80 or less 8. Get an annual eye exam.  Please ask your ophthalmologist to send [place] your report 9. Keep up with your dental cleanings and exams.

## 2014-10-18 NOTE — Progress Notes (Signed)
[facility] [address] [place] [address] [phone]  Date:  10/18/2014   Name:  Anne Rice   DOB:  October 31, 1924   MRN:  [id number]  PCP:  Lamar Blinks, MD    Chief Complaint: Follow-up   History of Present Illness:  Anne Rice is a 81 y.o. very pleasant female patient who presents with the following:  Here today to follow-up. History of diet controlled DM. She had bilateral cataract surgery recently and is pleased with the results   Lab Results  Component Value Date   HGBA1C 6.3 04/19/2014   She is fasting today for labs  Her son accompanies her today- he is her main caretaker BP is a little bit high but not out of hand  BP Readings from Last 3 Encounters:  10/18/14 142/84  04/19/14 146/84  01/11/14 142/90   Wt Readings from Last 3 Encounters:  10/18/14 207 lb 3.2 oz (93.985 kg)  04/19/14 208 lb (94.348 kg)  01/11/14 210 lb (95.255 kg)     Patient Active Problem List   Diagnosis Date Noted  . Type II or unspecified type diabetes mellitus without mention of complication, not stated as uncontrolled 01/11/2014  . Other and unspecified hyperlipidemia 01/11/2014  . Obesity, unspecified 01/11/2014  . Basal cell carcinoma of skin 09/04/2013  . HTN (hypertension) 05/18/2013  . Dementia 05/18/2013    Past Medical History  Diagnosis Date  . Depression     Past Surgical History  Procedure Laterality Date  . Appendectomy    . Abdominal hysterectomy      History  Substance Use Topics  . Smoking status: Never Smoker   . Smokeless tobacco: Never Used  . Alcohol Use: No    No family history on file.  No Known Allergies  Medication list has been reviewed and updated.  Current Outpatient Prescriptions on File Prior to Visit  Medication Sig Dispense Refill  . amLODipine (NORVASC) 2.5 MG tablet Take 1 tablet (2.5 mg total) by mouth daily. 90 tablet 0  . aspirin EC 81 MG tablet Take 81 mg by mouth daily. Pt taking 2  tablets daily    . diclofenac (VOLTAREN) 50 MG EC tablet Take 1 tablet (50 mg total) by mouth daily. 90 tablet 0  . diclofenac sodium (VOLTAREN) 1 % GEL Apply 2 g topically 4 (four) times daily. (Patient not taking: Reported on 10/18/2014) 1 Tube 5  . pravastatin (PRAVACHOL) 10 MG tablet Take 1 tablet (10 mg total) by mouth daily. 90 tablet 0   No current facility-administered medications on file prior to visit.    Review of Systems:  As per HPI- otherwise negative.   Physical Examination: Filed Vitals:   10/18/14 0819  BP: 142/84  Pulse: 69  Temp: 97.8 F (36.6 C)  Resp: 18   Filed Vitals:   10/18/14 0819  Height: 5\' 3"  (1.6 m)  Weight: 207 lb 3.2 oz (93.985 kg)   Body mass index is 36.71 kg/(m^2). Ideal Body Weight: Weight in (lb) to have BMI = 25: 140.8  GEN: WDWN, NAD, Non-toxic, A & O x 3, obese. Mild dementia, pleasant and cooperative  HEENT: Atraumatic, Normocephalic. Neck supple. No masses, No LAD. Ears and Nose: No external deformity. CV: RRR, No M/G/R. No JVD. No thrill. No extra heart sounds. PULM: CTA B, no wheezes, crackles, rhonchi. No retractions. No resp. distress. No accessory muscle use. ABD: S, NT, ND EXTR: No c/c/e NEURO Normal gait.  PSYCH: Normally interactive.  Conversant. Not depressed or anxious appearing.  Calm demeanor.  Normal foot exam bilaterally  Assessment and Plan: Diabetes mellitus type 2, controlled - Plan: Hemoglobin A1c  Dyslipidemia - Plan: Lipid panel, pravastatin (PRAVACHOL) 10 MG tablet  Essential hypertension  Medication monitoring encounter - Plan: Comprehensive metabolic panel  Await labs,  DM is generally well controlled Refilled cholesterol medication BP is acceptable  Follow-up in 6 months. rx for zostavax given  Signed Lamar Blinks, MD

## 2014-11-11 ENCOUNTER — Other Ambulatory Visit: Payer: Self-pay | Admitting: Family Medicine

## 2014-11-19 ENCOUNTER — Other Ambulatory Visit: Payer: Self-pay | Admitting: Family Medicine

## 2014-11-22 ENCOUNTER — Other Ambulatory Visit: Payer: Self-pay | Admitting: Family Medicine

## 2014-11-22 NOTE — Telephone Encounter (Signed)
Dr Lorelei Pont, you saw pt for check up in June and wanted f/up in 6 mos. I don't see OA discussed at that OV, OK to RF?

## 2015-01-07 ENCOUNTER — Other Ambulatory Visit: Payer: Self-pay | Admitting: Family Medicine

## 2015-04-08 ENCOUNTER — Encounter: Payer: Self-pay | Admitting: Family Medicine

## 2015-04-08 ENCOUNTER — Ambulatory Visit (INDEPENDENT_AMBULATORY_CARE_PROVIDER_SITE_OTHER): Payer: Medicare Other | Admitting: Family Medicine

## 2015-04-08 VITALS — BP 122/76 | HR 87 | Temp 97.5°F | Resp 16 | Ht 63.0 in | Wt 209.0 lb

## 2015-04-08 DIAGNOSIS — H00035 Abscess of left lower eyelid: Secondary | ICD-10-CM | POA: Diagnosis not present

## 2015-04-08 DIAGNOSIS — H00034 Abscess of left upper eyelid: Secondary | ICD-10-CM | POA: Diagnosis not present

## 2015-04-08 DIAGNOSIS — H00036 Abscess of eyelid left eye, unspecified eyelid: Secondary | ICD-10-CM

## 2015-04-08 NOTE — Progress Notes (Signed)
[facility] [address] [place] [address] [phone]  Date:  04/08/2015   Name:  Anne Rice   DOB:  [birth year]   MRN:  [id number]  PCP:  Lamar Blinks, MD    Chief Complaint: Eye Problem   History of Present Illness:  Anne Rice is a 81 y.o. very pleasant female patient who presents with the following:  Here today with a problem with her left eye. They first noted a problem with her eye last night.  It had seemed normal yesterday, but they she started "squinting" some with her eye and this am it was quite red. It is a bit tender.   She is not aware of any injury or other cause of her sx- however of note she has significant dementia  She is a pt at [facility] opthalmology- she had her cataracts removed in June and in August.   She does not wear any corrective lenses.  She feels as though her left eye is a bit blurry.    Patient Active Problem List   Diagnosis Date Noted  . Type II or unspecified type diabetes mellitus without mention of complication, not stated as uncontrolled 01/11/2014  . Other and unspecified hyperlipidemia 01/11/2014  . Obesity, unspecified 01/11/2014  . Basal cell carcinoma of skin 09/04/2013  . HTN (hypertension) 05/18/2013  . Dementia 05/18/2013    Past Medical History  Diagnosis Date  . Depression     Past Surgical History  Procedure Laterality Date  . Appendectomy    . Abdominal hysterectomy      Social History  Substance Use Topics  . Smoking status: Never Smoker   . Smokeless tobacco: Never Used  . Alcohol Use: No    No family history on file.  No Known Allergies  Medication list has been reviewed and updated.  Current Outpatient Prescriptions on File Prior to Visit  Medication Sig Dispense Refill  . amLODipine (NORVASC) 2.5 MG tablet Take 1 tablet (2.5 mg total) by mouth daily. 90 tablet 3  . aspirin EC 81 MG tablet Take 81 mg by mouth daily. Pt taking 2 tablets daily    . diclofenac (VOLTAREN)  50 MG EC tablet TAKE 1 TABLET BY MOUTH EVERY DAY 90 tablet 1  . diclofenac sodium (VOLTAREN) 1 % GEL Apply 2 g topically 4 (four) times daily. (Patient not taking: Reported on 10/18/2014) 1 Tube 5  . moxifloxacin (VIGAMOX) 0.5 % ophthalmic solution Place 1 drop into the right eye QID.     Marland Kitchen neomycin-bacitracin-polymyxin (NEOSPORIN) ophthalmic ointment Place 1 application into both eyes at bedtime.    . pravastatin (PRAVACHOL) 10 MG tablet Take 1 tablet (10 mg total) by mouth daily. 90 tablet 3  . pravastatin (PRAVACHOL) 10 MG tablet TAKE 1 TABLET BY MOUTH EVERY DAY 90 tablet 0  . prednisoLONE acetate (PRED FORTE) 1 % ophthalmic suspension Place 1 drop into both eyes 4 (four) times daily.     No current facility-administered medications on file prior to visit.    Review of Systems:  As per HPI- otherwise negative.   Physical Examination: Filed Vitals:   04/08/15 1459  BP: 122/76  Pulse: 87  Temp: 97.5 F (36.4 C)  Resp: 16    GEN: WDWN, NAD, Non-toxic, Alert, obese, here today with her son as usual HEENT: Atraumatic, Normocephalic. Neck supple. No masses, No LAD.  Her left eye is surrounded by redness, is mildly edematous and tender to touch. Slight matter on her  lashes  Negative fluorescin of cornea, negative limited fundoscopic exam  Ears and Nose: No external deformity. CV: RRR, No M/G/R. No JVD. No thrill. No extra heart sounds. PULM: CTA B, no wheezes, crackles, rhonchi. No retractions. No resp. distress. No accessory muscle use. EXTR: No c/c/e NEURO Normal gait.  PSYCH: Normally interactive, obviously demented but calm and comfortable.    Assessment and Plan: Cellulitis of eyelid, left  Here today with ?periorbital cellulitis. Complicated by significant dementia Called and spoke with her ophthalmologist Dr. Valetta Close.  He kindly offered to see her today in the office. They will go now  Signed Lamar Blinks, MD

## 2015-04-08 NOTE — Patient Instructions (Signed)
Please go straight to your opthalmology office - your eye doctor would like to see you today  [facility]  Address: [address], [place], [place] [address] [phone]

## 2015-04-11 DIAGNOSIS — H00034 Abscess of left upper eyelid: Secondary | ICD-10-CM | POA: Diagnosis not present

## 2015-04-25 DIAGNOSIS — D2312 Other benign neoplasm of skin of left eyelid, including canthus: Secondary | ICD-10-CM | POA: Diagnosis not present

## 2015-04-25 DIAGNOSIS — D2311 Other benign neoplasm of skin of right eyelid, including canthus: Secondary | ICD-10-CM | POA: Diagnosis not present

## 2015-04-25 DIAGNOSIS — Z961 Presence of intraocular lens: Secondary | ICD-10-CM | POA: Diagnosis not present

## 2015-05-12 ENCOUNTER — Other Ambulatory Visit: Payer: Self-pay | Admitting: Family Medicine

## 2015-05-13 ENCOUNTER — Encounter: Payer: Self-pay | Admitting: Family Medicine

## 2015-05-18 ENCOUNTER — Encounter: Payer: Self-pay | Admitting: Family Medicine

## 2015-08-10 ENCOUNTER — Other Ambulatory Visit: Payer: Self-pay

## 2015-08-10 MED ORDER — DICLOFENAC SODIUM 50 MG PO TBEC: DELAYED_RELEASE_TABLET | ORAL | Status: DC

## 2015-09-07 ENCOUNTER — Other Ambulatory Visit: Payer: Self-pay | Admitting: Physician Assistant

## 2015-09-07 ENCOUNTER — Telehealth: Payer: Self-pay

## 2015-09-07 DIAGNOSIS — E785 Hyperlipidemia, unspecified: Secondary | ICD-10-CM

## 2015-09-07 NOTE — Telephone Encounter (Signed)
Pt has an appointment with Dr. Brigitte Pulse on July 6 at [place] [facility]. Son wants to know if refills will be filled in the process.  Please advise  [phone]

## 2015-09-08 MED ORDER — DICLOFENAC SODIUM 50 MG PO TBEC: DELAYED_RELEASE_TABLET | ORAL | Status: DC

## 2015-09-08 MED ORDER — AMLODIPINE BESYLATE 2.5 MG PO TABS: 2.5000 mg | ORAL_TABLET | Freq: Every day | ORAL | Status: DC

## 2015-09-08 MED ORDER — PRAVASTATIN SODIUM 10 MG PO TABS: 10.0000 mg | ORAL_TABLET | Freq: Every day | ORAL | Status: DC

## 2015-09-08 NOTE — Telephone Encounter (Signed)
Rxs sent

## 2015-10-27 ENCOUNTER — Ambulatory Visit (INDEPENDENT_AMBULATORY_CARE_PROVIDER_SITE_OTHER): Payer: Medicare Other

## 2015-10-27 ENCOUNTER — Ambulatory Visit (INDEPENDENT_AMBULATORY_CARE_PROVIDER_SITE_OTHER): Payer: Medicare Other | Admitting: Family Medicine

## 2015-10-27 ENCOUNTER — Encounter: Payer: Self-pay | Admitting: Family Medicine

## 2015-10-27 VITALS — BP 156/88 | HR 86 | Temp 98.9°F | Resp 18 | Ht 63.0 in | Wt 213.0 lb

## 2015-10-27 DIAGNOSIS — R0989 Other specified symptoms and signs involving the circulatory and respiratory systems: Secondary | ICD-10-CM | POA: Diagnosis not present

## 2015-10-27 DIAGNOSIS — R600 Localized edema: Secondary | ICD-10-CM | POA: Diagnosis not present

## 2015-10-27 DIAGNOSIS — E119 Type 2 diabetes mellitus without complications: Secondary | ICD-10-CM

## 2015-10-27 DIAGNOSIS — E785 Hyperlipidemia, unspecified: Secondary | ICD-10-CM

## 2015-10-27 DIAGNOSIS — F0390 Unspecified dementia without behavioral disturbance: Secondary | ICD-10-CM

## 2015-10-27 DIAGNOSIS — Z5181 Encounter for therapeutic drug level monitoring: Secondary | ICD-10-CM | POA: Diagnosis not present

## 2015-10-27 DIAGNOSIS — I10 Essential (primary) hypertension: Secondary | ICD-10-CM

## 2015-10-27 DIAGNOSIS — H9190 Unspecified hearing loss, unspecified ear: Secondary | ICD-10-CM

## 2015-10-27 DIAGNOSIS — H6123 Impacted cerumen, bilateral: Secondary | ICD-10-CM | POA: Diagnosis not present

## 2015-10-27 DIAGNOSIS — R0609 Other forms of dyspnea: Secondary | ICD-10-CM

## 2015-10-27 DIAGNOSIS — R011 Cardiac murmur, unspecified: Secondary | ICD-10-CM | POA: Diagnosis not present

## 2015-10-27 LAB — POCT GLYCOSYLATED HEMOGLOBIN (HGB A1C): Hemoglobin A1C: 6.2

## 2015-10-27 NOTE — Progress Notes (Signed)
Subjective:    Patient ID: Anne Rice, female    DOB: 1924-12-01, 82 y.o.   MRN: [id number] Chief Complaint  Patient presents with  . Medication Refill    changing provider    HPI  Anne Rice is an 82 yo woman here today to follow-up on her chronic medical conditions.  This is my first time meeting this pt. She was last seen for the same 1 year prior by her prev PCP Dr. Lorelei Pont.  Her main caretaker is her son Anne Rice with whom she lives. She does not drive.  Her son lays out her med box for her so she is not aware of her medications.  Pre-DM: Has been diet controlled. Takes an asa 81 mg qd. Last yr a1c 6.3. Sees [facility] regularly- she just had bilateral eye surgery to correct cloudy vision and vision is normal. Urine microalb nml 03/2014 HPL: On pravastatin 10. Last year LDL 123, total 211, non-hdl 149. Not fasting today. HTN: On amlodipine 2.5 No med side effects. Arthralgias: On oral voltaren 50mg  prn - usually needs daily but will try skipping to see if it makes a difference. - top voltaren 1% gel is no longer needed  Demented but very mild and overall doing well so no desire for further eval now. Left handed No orthopnea More DOE, getting winded easier  Depression screen [facility] 2/9 10/27/2015 04/08/2015 10/18/2014 04/19/2014 01/11/2014  Decreased Interest 0 0 0 0 0  Down, Depressed, Hopeless 0 0 0 0 0  PHQ - 2 Score 0 0 0 0 0    Past Medical History  Diagnosis Date  . Depression    Past Surgical History  Procedure Laterality Date  . Appendectomy    . Abdominal hysterectomy     Current Outpatient Prescriptions on File Prior to Visit  Medication Sig Dispense Refill  . aspirin EC 81 MG tablet Take 81 mg by mouth daily. Pt taking 2 tablets daily    . moxifloxacin (VIGAMOX) 0.5 % ophthalmic solution Place 1 drop into the right eye QID. Reported on 10/27/2015    . neomycin-bacitracin-polymyxin (NEOSPORIN) ophthalmic ointment Place 1 application into both eyes at bedtime.  Reported on 10/27/2015     No current facility-administered medications on file prior to visit.   No Known Allergies History reviewed. No pertinent family history. Social History   Social History  . Marital Status: Unknown    Spouse Name: N/A  . Number of Children: N/A  . Years of Education: N/A   Social History Main Topics  . Smoking status: Never Smoker   . Smokeless tobacco: Never Used  . Alcohol Use: No  . Drug Use: No  . Sexual Activity: No   Other Topics Concern  . None   Social History Narrative    Review of Systems  Constitutional: Negative for fever, chills, activity change and appetite change.  HENT: Positive for hearing loss.   Respiratory: Positive for shortness of breath. Negative for cough, chest tightness and wheezing.   Cardiovascular: Positive for leg swelling. Negative for chest pain and palpitations.  Musculoskeletal: Positive for arthralgias.  Allergic/Immunologic: Negative for immunocompromised state.  Neurological: Positive for weakness. Negative for dizziness, syncope, facial asymmetry, speech difficulty, light-headedness and headaches.  Psychiatric/Behavioral: Positive for confusion. Negative for hallucinations, behavioral problems, sleep disturbance, dysphoric mood and agitation. The patient is not nervous/anxious.        Objective:  BP 156/88 mmHg  Pulse 86  Temp(Src) 98.9 F (37.2 C) (Oral)  Resp  18  Ht 5\' 3"  (1.6 m)  Wt 213 lb (96.616 kg)  BMI 37.74 kg/m2  SpO2 96%  Physical Exam  Constitutional: She is oriented to person, place, and time. She appears well-developed and well-nourished. No distress.  HENT:  Head: Normocephalic and atraumatic.  Right Ear: External ear normal.  Left Ear: External ear normal.  Eyes: Conjunctivae are normal. No scleral icterus.  Neck: Normal range of motion. Neck supple. No thyromegaly present.  Cardiovascular: Regular rhythm and intact distal pulses.  Tachycardia present.   Murmur heard.  Decrescendo  systolic murmur is present with a grade of 3/6  1+ pedal edema B  Pulmonary/Chest: Effort normal. No respiratory distress. She has rales (subtle fine inspiratory) in the left lower field.  Musculoskeletal: She exhibits no edema.  Lymphadenopathy:    She has no cervical adenopathy.  Neurological: She is alert and oriented to person, place, and time.  Skin: Skin is warm and dry. She is not diaphoretic. No erythema.  Psychiatric: She has a normal mood and affect. Her behavior is normal. Her mood appears not anxious. Her affect is not angry and not blunt. Her speech is tangential. Thought content is not paranoid and not delusional. Cognition and memory are impaired. She does not exhibit a depressed mood. She exhibits abnormal recent memory. She exhibits normal remote memory.     EKG: chest leads with poor r wave progression, no prior EKG for comparison, pt does not have a paper chart    Dg Chest 2 View  10/27/2015  CLINICAL DATA:  New murmur and left lower lobe rales. Clinical concern for CHF. EXAM: CHEST  2 VIEW COMPARISON:  None. FINDINGS: Normal heart size. Minimally tortuous atherosclerotic thoracic aorta. Otherwise normal mediastinal contour. No pneumothorax. No pleural effusion. Subcentimeter calcified granuloma at the right lung base. No pulmonary edema. No acute consolidative airspace disease. IMPRESSION: 1. Normal heart size.  No pulmonary edema. 2. No acute pulmonary disease. 3. Aortic atherosclerosis. Electronically Signed   By: Ilona Sorrel M.D.   On: 10/27/2015 14:27   Results for orders placed or performed in visit on 10/27/15  POCT glycosylated hemoglobin (Hb A1C)  Result Value Ref Range   Hemoglobin A1C 6.2    Lavage of bilateral ear canals to remove cerumen Assessment & Plan:   F/u in 6 mos  1. Type 2 diabetes mellitus without complication, without long-term current use of insulin ([place]) - unsure if foot exam was actually done - do at f/u.  2. Essential hypertension -  borderline control, cont amlodipine 2.5  3. Hyperlipidemia LDL goal <100 - not fasting today, has not been at goal for >2 yrs but had been improving. Needs flp in 6 mos at f/u OV. Cont pravastatin 10 for now  4. Medication monitoring encounter   5. Newly recognized murmur   6. Pedal edema   7. Chest rales   8. DOE (dyspnea on exertion)   9. Bilateral impacted cerumen - cma lavaged B  10. Dementia, without behavioral disturbance - pt presents with tangential and perseverating thoughts as well as some minor deficits in intermediate memory but on memory testing does remarkably well on long and short term memory - 2/3 on word recall after 5-10 min.  Mood excellent. Pt w/o desire for further eval now or to start trial of aricept or namenda.  11. Hard of hearing, unspecified laterality - offered audiology referral, pt declines for now    Orders Placed This Encounter  Procedures  . DG Chest 2  View    Standing Status: Future     Number of Occurrences: 1     Standing Expiration Date: 10/26/2016    Order Specific Question:  Reason for Exam (SYMPTOM  OR DIAGNOSIS REQUIRED)    Answer:  new murmur, LLL rales, concern for mild CHF    Order Specific Question:  Preferred imaging location?    Answer:  External  . Comprehensive metabolic panel    Order Specific Question:  Has the patient fasted?    Answer:  Yes  . Microalbumin/Creatinine Ratio, Urine  . Brain natriuretic peptide  . Ambulatory referral to Cardiology    Referral Priority:  Routine    Referral Type:  Consultation    Referral Reason:  Specialty Services Required    Requested Specialty:  Cardiology    Number of Visits Requested:  1  . Ear wax removal    Scheduling Instructions:     bilateral  . POCT glycosylated hemoglobin (Hb A1C)  . EKG 12-Lead  . HM DIABETES FOOT EXAM   Over 40 min spent in face-to-face evaluation of and consultation with patient and coordination of care.  Over 50% of this time was spent counseling this  patient.    Delman Cheadle, M.D.  [facility] [address] [place],  [address] [phone] phone [phone] fax  11/06/2015 4:25 PM  Results for orders placed or performed in visit on 10/27/15  Comprehensive metabolic panel  Result Value Ref Range   Sodium 139 135 - 146 mmol/L   Potassium 4.6 3.5 - 5.3 mmol/L   Chloride 102 98 - 110 mmol/L   CO2 23 20 - 31 mmol/L   Glucose, Bld 112 (H) 65 - 99 mg/dL   BUN 27 (H) 7 - 25 mg/dL   Creat 0.74 0.60 - 0.88 mg/dL   Total Bilirubin 0.5 0.2 - 1.2 mg/dL   Alkaline Phosphatase 79 33 - 130 U/L   AST 19 10 - 35 U/L   ALT 24 6 - 29 U/L   Total Protein 7.5 6.1 - 8.1 g/dL   Albumin 4.5 3.6 - 5.1 g/dL   Calcium 10.4 8.6 - 10.4 mg/dL  Microalbumin/Creatinine Ratio, Urine  Result Value Ref Range   Creatinine, Urine 210 20 - 320 mg/dL   Microalb, Ur 3.4 Not estab mg/dL   Microalb Creat Ratio 16 <30 mcg/mg creat  Brain natriuretic peptide  Result Value Ref Range   Brain Natriuretic Peptide 47.1 <100 pg/mL  POCT glycosylated hemoglobin (Hb A1C)  Result Value Ref Range   Hemoglobin A1C 6.2

## 2015-10-27 NOTE — Patient Instructions (Signed)
     IF you received an x-ray today, you will receive an invoice from [facility]. Please contact [facility] at [phone] with questions or concerns regarding your invoice.   IF you received labwork today, you will receive an invoice from [facility]. Please contact [facility] at [phone] with questions or concerns regarding your invoice.   Our billing staff will not be able to assist you with questions regarding bills from these companies.  You will be contacted with the lab results as soon as they are available. The fastest way to get your results is to activate your My Chart account. Instructions are located on the last page of this paperwork. If you have not heard from us regarding the results in 2 weeks, please contact this office.      

## 2015-10-28 LAB — BRAIN NATRIURETIC PEPTIDE: BRAIN NATRIURETIC PEPTIDE: 47.1 pg/mL (ref <100)

## 2015-10-28 LAB — COMPREHENSIVE METABOLIC PANEL
ALBUMIN: 4.5 g/dL (ref 3.6–5.1)
ALT: 24 U/L (ref 6–29)
AST: 19 U/L (ref 10–35)
Alkaline Phosphatase: 79 U/L (ref 33–130)
BILIRUBIN TOTAL: 0.5 mg/dL (ref 0.2–1.2)
BUN: 27 mg/dL — AB (ref 7–25)
CO2: 23 mmol/L (ref 20–31)
CREATININE: 0.74 mg/dL (ref 0.60–0.88)
Calcium: 10.4 mg/dL (ref 8.6–10.4)
Chloride: 102 mmol/L (ref 98–110)
Glucose, Bld: 112 mg/dL — ABNORMAL HIGH (ref 65–99)
Potassium: 4.6 mmol/L (ref 3.5–5.3)
SODIUM: 139 mmol/L (ref 135–146)
TOTAL PROTEIN: 7.5 g/dL (ref 6.1–8.1)

## 2015-10-28 LAB — MICROALBUMIN / CREATININE URINE RATIO
CREATININE, URINE: 210 mg/dL (ref 20–320)
MICROALB UR: 3.4 mg/dL
Microalb Creat Ratio: 16 mcg/mg creat (ref <30)

## 2015-11-06 ENCOUNTER — Encounter: Payer: Self-pay | Admitting: Family Medicine

## 2015-11-06 MED ORDER — PRAVASTATIN SODIUM 10 MG PO TABS: 10.0000 mg | ORAL_TABLET | Freq: Every day | ORAL | Status: DC

## 2015-11-06 MED ORDER — DICLOFENAC SODIUM 50 MG PO TBEC: 50.0000 mg | DELAYED_RELEASE_TABLET | Freq: Every day | ORAL | Status: DC | PRN

## 2015-11-06 MED ORDER — AMLODIPINE BESYLATE 2.5 MG PO TABS: 2.5000 mg | ORAL_TABLET | Freq: Every day | ORAL | Status: DC

## 2015-12-06 ENCOUNTER — Encounter (INDEPENDENT_AMBULATORY_CARE_PROVIDER_SITE_OTHER): Payer: Self-pay

## 2015-12-06 ENCOUNTER — Ambulatory Visit (INDEPENDENT_AMBULATORY_CARE_PROVIDER_SITE_OTHER): Payer: Medicare Other | Admitting: Cardiovascular Disease

## 2015-12-06 ENCOUNTER — Encounter: Payer: Self-pay | Admitting: Cardiovascular Disease

## 2015-12-06 VITALS — BP 120/78 | HR 71 | Ht 63.0 in | Wt 209.0 lb

## 2015-12-06 DIAGNOSIS — I10 Essential (primary) hypertension: Secondary | ICD-10-CM | POA: Diagnosis not present

## 2015-12-06 DIAGNOSIS — E785 Hyperlipidemia, unspecified: Secondary | ICD-10-CM | POA: Diagnosis not present

## 2015-12-06 DIAGNOSIS — R011 Cardiac murmur, unspecified: Secondary | ICD-10-CM

## 2015-12-06 HISTORY — DX: Cardiac murmur, unspecified: R01.1

## 2015-12-06 NOTE — Patient Instructions (Signed)
Medication Instructions:  Your physician recommends that you continue on your current medications as directed. Please refer to the Current Medication list given to you today.  Labwork: NONE  Testing/Procedures: Your physician has requested that you have an echocardiogram. Echocardiography is a painless test that uses sound waves to create images of your heart. It provides your doctor with information about the size and shape of your heart and how well your heart's chambers and valves are working. This procedure takes approximately one hour. There are no restrictions for this procedure. [address]  Follow-Up: Your physician recommends that you schedule a follow-up appointment in: [place]  If you need a refill on your cardiac medications before your next appointment, please call your pharmacy.

## 2015-12-06 NOTE — Progress Notes (Signed)
Cardiology Office Note   Date:  12/06/2015   ID:  Anne Rice, DOB [birth year], MRN [id number]  PCP:  Delman Cheadle, MD  Cardiologist:   Skeet Latch, MD   Chief Complaint  Patient presents with  . New Patient (Initial Visit)    Pt states no Sx. Referred due to new murmur      History of Present Illness: Anne Rice is a 82 y.o. female with hypertension, diabetes, hyperlipidemia and dementia who presents for murmur.  Anne Rice saw Dr.Eva Brigitte Rice on 10/27/15 and was noted to have a murmur, crackles and lower extremity edema.  Anne Rice is accompanied by her son, Anne Rice, with whom she lives.  She reports that she is feeling well and denies chest pain or shortness of breath.  She goes to the store and walks holding a shopping cart for 1-2 hours daily.  She has not noted any lower extremity edema, orthopnea or PND. She has never been told that she had a murmur in the past.     Past Medical History:  Diagnosis Date  . Depression   . Murmur 12/06/2015    Past Surgical History:  Procedure Laterality Date  . ABDOMINAL HYSTERECTOMY    . APPENDECTOMY       Current Outpatient Prescriptions  Medication Sig Dispense Refill  . amLODipine (NORVASC) 2.5 MG tablet Take 1 tablet (2.5 mg total) by mouth daily. 90 tablet 1  . aspirin EC 81 MG tablet Take 81 mg by mouth once. Pt taking 2 tablets daily     . diclofenac (VOLTAREN) 50 MG EC tablet Take 1 tablet (50 mg total) by mouth daily as needed for moderate pain. 90 tablet 1  . pravastatin (PRAVACHOL) 10 MG tablet Take 1 tablet (10 mg total) by mouth daily. 90 tablet 1   No current facility-administered medications for this visit.     Allergies:   Review of patient's allergies indicates no known allergies.    Social History:  The patient  reports that she quit smoking about 48 years ago. She has never used smokeless tobacco. She reports that she does not drink alcohol or use drugs.   Family History:  The patient's family history  includes Dementia in her brother; Parkinson's disease in her father.    ROS:  Please see the history of present illness.   Otherwise, review of systems are positive for occasional sharp epigastric pain.   All other systems are reviewed and negative.    PHYSICAL EXAM: VS:  BP 120/78   Rice 71   Ht 5\' 3"  (1.6 m)   Wt 209 lb (94.8 kg)   BMI 37.02 kg/m  , BMI Body mass index is 37.02 kg/m. GENERAL:  Well appearing HEENT:  Pupils equal round and reactive, fundi not visualized, oral mucosa unremarkable NECK:  No jugular venous distention, waveform within normal limits, carotid upstroke brisk and symmetric, no bruits, no thyromegaly LYMPHATICS:  No cervical adenopathy LUNGS:  Clear to auscultation bilaterally HEART:  RRR.  PMI not displaced or sustained,S1 and S2 within normal limits, no S3, no S4, no clicks, no rubs, II/VI early to mid-peaking sysotlic murmur at the LUSB ABD:  Flat, positive bowel sounds normal in frequency in pitch, no bruits, no rebound, no guarding, no midline pulsatile mass, no hepatomegaly, no splenomegaly EXT:  2 plus pulses throughout, no edema, no cyanosis no clubbing SKIN:  No rashes no nodules NEURO:  Cranial nerves II through XII grossly intact, motor grossly intact throughout PSYCH:  Cognitively intact, oriented to person place and time   EKG:  EKG is ordered today. The ekg ordered today demonstrates sinus rhythm rate 71 bpm.    Recent Labs: 10/27/2015: ALT 24; Brain Natriuretic Peptide 47.1; BUN 27; Creat 0.74; Potassium 4.6; Sodium 139    Lipid Panel    Component Value Date/Time   CHOL 211 (H) 10/18/2014 0859   TRIG 130 10/18/2014 0859   HDL 62 10/18/2014 0859   CHOLHDL 3.4 10/18/2014 0859   VLDL 26 10/18/2014 0859   LDLCALC 123 (H) 10/18/2014 0859      Wt Readings from Last 3 Encounters:  12/06/15 209 lb (94.8 kg)  10/27/15 213 lb (96.6 kg)  04/08/15 209 lb (94.8 kg)      ASSESSMENT AND PLAN:  # Murmur: It sounds like Anne Rice has  mild to moderate aortic stenosis.  She is completely asymptomatic at this time. We will obtain an echo to evaluate the degree of her valvular heart disease.  I had an extensive talk with Anne Rice and her son about the management of aortic stenosis.  They understand that she would not be an operative candidate and hope that she would be well enough to have TAVR should the need arise.   # Hypertension: Blood pressure is well-controlled.  Continue amlodipine.   # Hyperlipidemia: Continue pravastatin.   Current medicines are reviewed at length with the patient today.  The patient does not have concerns regarding medicines.  The following changes have been made:  no change  Labs/ tests ordered today include:   Orders Placed This Encounter  Procedures  . EKG 12-Lead  . ECHOCARDIOGRAM COMPLETE     Disposition:   FU with Seona Clemenson C. Oval Linsey, MD, [facility] in 4 months.    This note was written with the assistance of speech recognition software.  Please excuse any transcriptional errors.  Signed, Eilah Common C. Oval Linsey, MD, [facility]  12/06/2015 1:06 PM    [facility]

## 2015-12-19 ENCOUNTER — Other Ambulatory Visit: Payer: Self-pay

## 2015-12-19 ENCOUNTER — Ambulatory Visit (HOSPITAL_COMMUNITY): Payer: Medicare Other | Attending: Cardiology

## 2015-12-19 DIAGNOSIS — R011 Cardiac murmur, unspecified: Secondary | ICD-10-CM | POA: Diagnosis not present

## 2015-12-19 DIAGNOSIS — Z87891 Personal history of nicotine dependence: Secondary | ICD-10-CM | POA: Insufficient documentation

## 2015-12-19 DIAGNOSIS — E119 Type 2 diabetes mellitus without complications: Secondary | ICD-10-CM | POA: Diagnosis not present

## 2015-12-19 DIAGNOSIS — E669 Obesity, unspecified: Secondary | ICD-10-CM | POA: Insufficient documentation

## 2015-12-19 DIAGNOSIS — I351 Nonrheumatic aortic (valve) insufficiency: Secondary | ICD-10-CM | POA: Diagnosis not present

## 2015-12-19 DIAGNOSIS — E785 Hyperlipidemia, unspecified: Secondary | ICD-10-CM | POA: Diagnosis not present

## 2015-12-19 DIAGNOSIS — Z6837 Body mass index (BMI) 37.0-37.9, adult: Secondary | ICD-10-CM | POA: Insufficient documentation

## 2015-12-19 DIAGNOSIS — I119 Hypertensive heart disease without heart failure: Secondary | ICD-10-CM | POA: Diagnosis not present

## 2016-01-03 ENCOUNTER — Telehealth: Payer: Self-pay | Admitting: Cardiovascular Disease

## 2016-01-03 NOTE — Telephone Encounter (Signed)
New message   Pt husband verbalized that he received a call and that he do not have vm and he believe it is for the results of pt results

## 2016-01-03 NOTE — Telephone Encounter (Signed)
Returned call to son (ok per DPR)-aware of results and recommendations.  Verbalized understanding and request copy be mailed to patient address.    Address verified and copy mailed.

## 2016-03-01 ENCOUNTER — Other Ambulatory Visit: Payer: Self-pay | Admitting: Physician Assistant

## 2016-03-01 DIAGNOSIS — E785 Hyperlipidemia, unspecified: Secondary | ICD-10-CM

## 2016-04-04 ENCOUNTER — Ambulatory Visit: Payer: Medicare Other | Admitting: Cardiovascular Disease

## 2016-04-06 ENCOUNTER — Encounter (INDEPENDENT_AMBULATORY_CARE_PROVIDER_SITE_OTHER): Payer: Self-pay

## 2016-04-06 ENCOUNTER — Encounter: Payer: Self-pay | Admitting: Physician Assistant

## 2016-04-06 ENCOUNTER — Ambulatory Visit (INDEPENDENT_AMBULATORY_CARE_PROVIDER_SITE_OTHER): Payer: Medicare Other | Admitting: Physician Assistant

## 2016-04-06 VITALS — BP 129/78 | HR 97 | Ht 63.0 in | Wt 212.0 lb

## 2016-04-06 DIAGNOSIS — R0609 Other forms of dyspnea: Secondary | ICD-10-CM

## 2016-04-06 DIAGNOSIS — F0390 Unspecified dementia without behavioral disturbance: Secondary | ICD-10-CM | POA: Diagnosis not present

## 2016-04-06 DIAGNOSIS — R011 Cardiac murmur, unspecified: Secondary | ICD-10-CM | POA: Diagnosis not present

## 2016-04-06 NOTE — Patient Instructions (Signed)
Your physician wants you to follow-up in: [place] will receive a reminder letter in the mail two months in advance. If you don't receive a letter, please call our office to schedule the follow-up appointment.   If you need a refill on your cardiac medications before your next appointment, please call your pharmacy.   TRY TO EXERCISE 5 X WEEKLY  CHECK WITH THE SENIOR CENTER ABOUT EXERCISE PROGRAMS  TRACK WEIGHT  REPORT SWELLING OR SHORTNESS OF BREATH

## 2016-04-06 NOTE — Progress Notes (Signed)
Cardiology Office Note   Date:  04/06/2016   ID:  Anne Rice, DOB 1924/07/30, MRN [id number]  PCP:  Delman Cheadle, MD  Cardiologist:  Dr Oval Linsey 12/06/2015 Rosaria Ferries, PA-C   No chief complaint on file.   History of Present Illness: Anne Rice is a 82 y.o. female with a history of HTN, DM, HLD, dementia. S/p eval for SEM w/ echo>trivial AR, grade 1 dd, nl EF by echo.  Anne Rice presents for follow up. Her son is with her. She lives with him. He is her caregiver, makes sure she takes her meds and tries to increase her ambulation.  Anne Rice denies chest pain or SOB. She gets a little dyspnea when out shopping or walking, but nothing significant. She has no palpitations. She denies LE edema, orthopnea or PND. She does not have palpitations, presyncope or syncope.  She feels she eats well and is not having any other issues right now.   Past Medical History:  Diagnosis Date  . Depression   . Murmur 12/06/2015    Past Surgical History:  Procedure Laterality Date  . ABDOMINAL HYSTERECTOMY    . APPENDECTOMY      Current Outpatient Prescriptions  Medication Sig Dispense Refill  . amLODipine (NORVASC) 2.5 MG tablet Take 1 tablet (2.5 mg total) by mouth daily. 90 tablet 1  . aspirin EC 81 MG tablet Take 81 mg by mouth once. Pt taking 2 tablets daily     . diclofenac (VOLTAREN) 50 MG EC tablet Take 1 tablet (50 mg total) by mouth daily as needed for moderate pain. 90 tablet 1  . pravastatin (PRAVACHOL) 10 MG tablet Take 1 tablet (10 mg total) by mouth daily. 90 tablet 1   No current facility-administered medications for this visit.     Allergies:   Patient has no known allergies.    Social History:  The patient  reports that she quit smoking about 48 years ago. She has never used smokeless tobacco. She reports that she does not drink alcohol or use drugs.   Family History:  The patient's family history includes Dementia in her brother; Parkinson's disease  in her father.    ROS:  Please see the history of present illness. All other systems are reviewed and negative.    PHYSICAL EXAM: VS:  BP 129/78   Pulse 97   Ht 5\' 3"  (1.6 m)   Wt 212 lb (96.2 kg)   BMI 37.55 kg/m  , BMI Body mass index is 37.55 kg/m. GEN: Well nourished, well developed, female in no acute distress  HEENT: normal for age  Neck: no JVD, no carotid bruit, no masses Cardiac: RRR; 2-3/6 murmur, no rubs, or gallops Respiratory: slightly decreased BS bases bilaterally, normal work of breathing GI: soft, nontender, nondistended, + BS Anne: no deformity or atrophy; no edema L, trace-1+ edema R; distal pulses are 2+ in all 4 extremities   Skin: warm and dry, no rash Neuro:  Strength and sensation are intact Psych: euthymic mood, full affect   EKG:  EKG is not ordered today.  ECHO: 12/19/2015 - Left ventricle: The cavity size was normal. Systolic function was   normal. The estimated ejection fraction was in the range of 60%   to 65%. Wall motion was normal; there were no regional wall   motion abnormalities. Doppler parameters are consistent with   abnormal left ventricular relaxation (grade 1 diastolic dysfunction). - Aortic valve: There was trivial regurgitation. Mean gradient 14 mm,  peak gradient 26 mm - Left atrium: The atrium was mildly dilated.  Recent Labs: 10/27/2015: ALT 24; Brain Natriuretic Peptide 47.1; BUN 27; Creat 0.74; Potassium 4.6; Sodium 139    Lipid Panel    Component Value Date/Time   CHOL 211 (H) 10/18/2014 0859   TRIG 130 10/18/2014 0859   HDL 62 10/18/2014 0859   CHOLHDL 3.4 10/18/2014 0859   VLDL 26 10/18/2014 0859   LDLCALC 123 (H) 10/18/2014 0859     Wt Readings from Last 3 Encounters:  04/06/16 212 lb (96.2 kg)  12/06/15 209 lb (94.8 kg)  10/27/15 213 lb (96.6 kg)     Other studies Reviewed: Additional studies/ records that were reviewed today include: office notes and testing.  ASSESSMENT AND PLAN:  1.  SEM: follow,  currently asymptomatic  2. DOE: According to her son, she is getting some DOE when they walk. She has mild daytime edema. Repeat echo in a year. She and her son were told to watch for a change in her symptoms and call prn.  3. Dementia: Son cares for her and does not leave her alone much. She is compliant with meds (thanks to him).   Current medicines are reviewed at length with the patient today.  The patient does not have concerns regarding medicines.  The following changes have been made:  no change  Labs/ tests ordered today include:  ECHO in 1 year   Disposition:   FU with Dr Oval Linsey  Signed, Rosaria Ferries, PA-C  04/06/2016 2:14 PM    [place] Phone: [phone]; Fax: [phone]  This note was written with the assistance of speech recognition software. Please excuse any transcriptional errors.

## 2016-05-12 ENCOUNTER — Other Ambulatory Visit: Payer: Self-pay | Admitting: Family Medicine

## 2016-05-12 ENCOUNTER — Other Ambulatory Visit: Payer: Self-pay | Admitting: Physician Assistant

## 2016-05-12 DIAGNOSIS — E785 Hyperlipidemia, unspecified: Secondary | ICD-10-CM

## 2016-05-13 NOTE — Telephone Encounter (Signed)
10/2015 last ov

## 2016-05-22 ENCOUNTER — Telehealth: Payer: Self-pay | Admitting: Cardiovascular Disease

## 2016-05-22 NOTE — Telephone Encounter (Signed)
New Message  Pt son call requesting to speak with RN. Pt son states pt receive a call from the office. Please call back to discuss if needed

## 2016-05-22 NOTE — Telephone Encounter (Signed)
I do not see that we called

## 2016-05-28 ENCOUNTER — Encounter: Payer: Self-pay | Admitting: Family Medicine

## 2016-05-28 ENCOUNTER — Ambulatory Visit (INDEPENDENT_AMBULATORY_CARE_PROVIDER_SITE_OTHER): Payer: Medicare Other | Admitting: Family Medicine

## 2016-05-28 ENCOUNTER — Telehealth: Payer: Self-pay | Admitting: *Deleted

## 2016-05-28 VITALS — BP 142/80 | HR 94 | Temp 98.3°F | Resp 18 | Ht 63.0 in | Wt 214.0 lb

## 2016-05-28 DIAGNOSIS — E785 Hyperlipidemia, unspecified: Secondary | ICD-10-CM | POA: Diagnosis not present

## 2016-05-28 DIAGNOSIS — Z5181 Encounter for therapeutic drug level monitoring: Secondary | ICD-10-CM | POA: Diagnosis not present

## 2016-05-28 DIAGNOSIS — E119 Type 2 diabetes mellitus without complications: Secondary | ICD-10-CM | POA: Diagnosis not present

## 2016-05-28 DIAGNOSIS — H9190 Unspecified hearing loss, unspecified ear: Secondary | ICD-10-CM | POA: Diagnosis not present

## 2016-05-28 DIAGNOSIS — R2689 Other abnormalities of gait and mobility: Secondary | ICD-10-CM

## 2016-05-28 DIAGNOSIS — F0390 Unspecified dementia without behavioral disturbance: Secondary | ICD-10-CM | POA: Diagnosis not present

## 2016-05-28 DIAGNOSIS — I10 Essential (primary) hypertension: Secondary | ICD-10-CM | POA: Diagnosis not present

## 2016-05-28 LAB — POCT URINALYSIS DIP (MANUAL ENTRY)
BILIRUBIN UA: NEGATIVE
GLUCOSE UA: NEGATIVE
Ketones, POC UA: NEGATIVE
NITRITE UA: NEGATIVE
PH UA: 5.5
PROTEIN UA: NEGATIVE
RBC UA: NEGATIVE
Spec Grav, UA: 1.025
UROBILINOGEN UA: 0.2

## 2016-05-28 LAB — POCT GLYCOSYLATED HEMOGLOBIN (HGB A1C): Hemoglobin A1C: 6.3

## 2016-05-28 MED ORDER — AMLODIPINE BESYLATE 2.5 MG PO TABS: 2.5000 mg | ORAL_TABLET | Freq: Every day | ORAL | 3 refills | Status: DC

## 2016-05-28 MED ORDER — DICLOFENAC SODIUM 50 MG PO TBEC: DELAYED_RELEASE_TABLET | ORAL | 1 refills | Status: DC

## 2016-05-28 MED ORDER — PRAVASTATIN SODIUM 10 MG PO TABS: 10.0000 mg | ORAL_TABLET | Freq: Every day | ORAL | 1 refills | Status: DC

## 2016-05-28 NOTE — Telephone Encounter (Signed)
Advised she is due for ov, last seen 10/2015 tx up front for appt

## 2016-05-28 NOTE — Telephone Encounter (Signed)
Patient's grandson called regarding patients medication, patient gets a 29 day suppy of medication and this last refill was only 30 day supply, please call patient's grandson [phone] back explaining why there was only a 30 day supply

## 2016-05-28 NOTE — Patient Instructions (Addendum)
IF you received an x-ray today, you will receive an invoice from [facility] Rad[facility]. Please contact [facility] at [phone] with questions or concerns regarding your invoice.   IF you received labwork today, you will receive an invoice from [facility]. Please contact [facility] at [phone] with questions or concerns regarding your invoice.   Our billing staff will not be able to assist you with questions regarding bills from these companies.  You will be contacted with the lab results as soon as they are available. The fastest way to get your results is to activate your My Chart account. Instructions are located on the last page of this paperwork. If you have not heard from [place] regarding the results in 2 weeks, please contact this office.    Alzheimer Disease Caregiver Guide Alzheimer disease is an illness that affects a person's brain. It causes a person to lose the ability to remember things and make good decisions. As the disease progresses, the person is unable to take care of himself or herself and needs more and more help to do simple tasks. Taking care of someone with Alzheimer disease can be very challenging and overwhelming. Memory loss and confusion Memory loss and confusion is mild in the beginning stages of the disease. Both of these problems become more severe as the disease progresses. Eventually, the person will not recognize places or even close family members and friends.  Stay calm.  Respond with a short explanation. Long explanations can be overwhelming and confusing.  Avoid corrections that sound like scolding.  Try not to take it personally, even if the person forgets your name. Behavior changes Behavior changes are part of the disease. The person may develop depression, anxiety, anger, hallucinations, or other behavior changes. These changes can come on suddenly and may be in response to pain, infection, changes in the environment (temperature,  noise), overstimulation, or feeling lost or scared.  Try not to take behavior changes personally.  Remain calm and patient.  Do not argue or try to convince the person about a specific point. This will only make him or her more agitated.  Know that the behavior changes are part of the disease process and try to work through it. Tips to reduce frustration  Schedule wisely by making appointments and doing daily tasks, like bathing and dressing, when the person is at his or her best.  Take your time. Simple tasks may take a lot longer, so be sure to allow for plenty of time.  Limit choices. Too many choices can be overwhelming and stressful for the person.  Involve the person in what you are doing.  Stick to a routine.  Avoid new or crowded situations, if possible.  Use simple words, short sentences, and a calm voice. Only give one direction at a time.  Buy clothes and shoes that are easy to put on and take off.  Let people help if they offer. Home safety Keeping the home safe is very important to reduce the risk of falls and injuries.  Keep floors clear of clutter. Remove rugs, magazine racks, and floor lamps.  Keep hallways well lit.  Put a handrail and nonslip mat in the bathtub or shower.  Put childproof locks on cabinets with dangerous items, such as medicine, alcohol, guns, toxic cleaning items, sharp tools or utensils, matches, or lighters.  Place locks on doors where the person cannot easily see or reach them. This helps ensure that the person cannot wander out of the house  and get lost.  Be prepared for emergencies. Keep a list of emergency phone numbers and addresses in a convenient area. Plans for the future  Do not put off talking about finances.  Talk about money management. People with Alzheimer disease have trouble managing their money as the disease gets worse.  Get help from professional advisors regarding financial and legal matters.  Do not put off  talking about future care.  Choose a power of attorney. This is someone who can make decisions for the person with Alzheimer disease when he or she is no longer able to do so.  Talk about driving and when it is the right time to stop. The person's health care provider can help give advice on this matter.  Talk about the person's living situation. If he or she lives alone, you need to make sure he or she is safe. Some people need extra help at home, and others need more care at a nursing home or care center. Support groups Joining a support group can be very helpful for caregivers of people with Alzheimer disease. Some advantages to being part of a support group include:  Getting strategies to manage stress.  Sharing experiences with others.  Receiving emotional comfort and support.  Learning new caregiving skills as the disease progresses.  Knowing what community resources are available and taking advantage of them. Contact a health care provider if:  The person has a fever.  The person has a sudden change in behavior that does not improve with calming strategies.  The person is unable to manage in his or her current living situation.  The person threatens you or anyone else, including himself or herself.  You are no longer able to care for the person. This information is not intended to replace advice given to you by your health care provider. Make sure you discuss any questions you have with your health care provider. Document Released: 12/20/2003 Document Revised: 09/21/2015 Document Reviewed: 05/16/2011 Elsevier Interactive Patient Education  2017 [employer].

## 2016-05-28 NOTE — Progress Notes (Signed)
Subjective:    Patient ID: Anne Rice, female    DOB: 14-Jul-1924, 83 y.o.   MRN: [id number]  Chief Complaint  Patient presents with  . Follow-up    3 month/  . Medication Refill    ALL MEDS 69 DAYS    HPI Anne Rice is an 83 yo woman here today to follow-up on her chronic medical conditions.  This is my first time meeting this pt. She was last seen for the same 1 year prior by her prev PCP Dr. Lorelei Pont.  Her main caretaker is her son [name] with whom she lives. She does not drive.  Her son lays out her med box for her so she is not aware of her medications.  Pre-DM: Has been diet controlled. Takes an asa 81 mg qd. Last yr a1c 6.3->6.2. Sees [facility] regularly- she just had bilateral eye surgery to correct cloudy vision and vision is normal. Urine microalb nml 03/2014 HPL: On pravastatin 10. Last year LDL 123, total 211, non-hdl 149. Not fasting today nor at her prior visit so has not had lipid panel in well > 1 yr.  HTN: On amlodipine 2.5 No med side effects.  Arthralgias: On oral voltaren 50mg  prn - usually needs daily but will try skipping to see if it makes a difference. - top voltaren 1% gel is no longer needed  Demented but very mild and overall doing well so no desire for further eval now. Left handed Hard of hearing and has not wanted audiology eval or hearing aides.  Has seen cardiology several times.   Increased wobbly when walking. No falls, balance fine.   120/78  She takes the diclofenac every morning. - do trial off - for shoulder - right  Past Medical History:  Diagnosis Date  . Depression   . Murmur 12/06/2015   Past Surgical History:  Procedure Laterality Date  . ABDOMINAL HYSTERECTOMY    . APPENDECTOMY     Current Outpatient Prescriptions on File Prior to Visit  Medication Sig Dispense Refill  . aspirin EC 81 MG tablet Take 81 mg by mouth daily. Pt taking 2 tablets daily      No current facility-administered medications on file prior to  visit.    No Known Allergies Family History  Problem Relation Age of Onset  . Parkinson's disease Father   . Dementia Brother    Social History   Social History  . Marital status: Unknown    Spouse name: N/A  . Number of children: N/A  . Years of education: N/A   Social History Main Topics  . Smoking status: Former Smoker    Quit date: 04/24/1967  . Smokeless tobacco: Never Used  . Alcohol use No  . Drug use: No  . Sexual activity: No   Other Topics Concern  . None   Social History Narrative  . None   Depression screen [facility] 2/9 05/28/2016 10/27/2015 04/08/2015 10/18/2014 04/19/2014  Decreased Interest 0 0 0 0 0  Down, Depressed, Hopeless 0 0 0 0 0  PHQ - 2 Score 0 0 0 0 0    Review of Systems See hpi    Objective:   Physical Exam  Constitutional: She is oriented to person, place, and time. She appears well-developed and well-nourished. No distress.  HENT:  Head: Normocephalic and atraumatic.  Right Ear: External ear normal.  Left Ear: External ear normal.  Eyes: Conjunctivae are normal. No scleral icterus.  Neck: Normal range of motion. Neck  supple. No thyromegaly present.  Cardiovascular: Normal rate, regular rhythm and intact distal pulses.   Murmur heard.  Systolic murmur is present with a grade of 2/6  Pulmonary/Chest: Effort normal and breath sounds normal. No respiratory distress.  Musculoskeletal: She exhibits no edema.  Lymphadenopathy:    She has no cervical adenopathy.  Neurological: She is alert and oriented to person, place, and time.  Skin: Skin is warm and dry. She is not diaphoretic. No erythema.  Psychiatric: She has a normal mood and affect. Her behavior is normal.   Right foot planted leading to antalgic gait.  BP (!) 142/80 (BP Location: Right Arm, Patient Position: Sitting, Cuff Size: Large)   Pulse 94   Temp 98.3 F (36.8 C) (Oral)   Resp 18   Ht 5\' 3"  (1.6 m)   Wt 214 lb (97.1 kg)   SpO2 95%   BMI 37.91 kg/m      Diabetic Foot  Exam - Simple   Simple Foot Form Diabetic Foot exam was performed with the following findings:  Yes 05/28/2016  4:42 PM  Visual Inspection Sensation Testing Pulse Check Posterior Tibialis and Dorsalis pulse intact bilaterally:  Yes Comments Pre-ulcerative calluses, decreased sensation to monofil in multiple places      Assessment & Plan:  Needs foot exam  1. Type 2 diabetes mellitus without complication, without long-term current use of insulin ([place])   2. Essential hypertension  - borderline control  Consider increasing amlodipine.  3. Hyperlipidemia LDL goal <100 - at goal, refilled pravastatin x 6 mos  4. Medication monitoring encounter   5. Dementia without behavioral disturbance, unspecified dementia type   6. Hearing loss, unspecified hearing loss type, unspecified laterality   7. Antalgic gait   8. Dyslipidemia     Orders Placed This Encounter  Procedures  . Urine culture  . Comprehensive metabolic panel  . Lipid panel    Standing Status:   Future    Standing Expiration Date:   05/28/2017    Order Specific Question:   Has the patient fasted?    Answer:   Yes  . Ambulatory referral to Podiatry    Referral Priority:   Routine    Referral Type:   Consultation    Referral Reason:   Specialty Services Required    Requested Specialty:   Podiatry    Number of Visits Requested:   1  . POCT glycosylated hemoglobin (Hb A1C)  . POCT urinalysis dipstick  . HM DIABETES FOOT EXAM    Meds ordered this encounter  Medications  . pravastatin (PRAVACHOL) 10 MG tablet    Sig: Take 1 tablet (10 mg total) by mouth daily.    Dispense:  90 tablet    Refill:  1  . amLODipine (NORVASC) 2.5 MG tablet    Sig: Take 1 tablet (2.5 mg total) by mouth daily.    Dispense:  90 tablet    Refill:  3  . diclofenac (VOLTAREN) 50 MG EC tablet    Sig: TAKE 1 TABLET BY MOUTH EVERY DAY AS NEEDED FOR MODERATE PAIN    Dispense:  90 tablet    Refill:  1    Delman Cheadle, M.D.  Primary Care at [facility] [address] [place], [place] [address] [phone] phone [phone] fax  06/17/16 12:05 AM

## 2016-05-29 ENCOUNTER — Encounter: Payer: Self-pay | Admitting: Family Medicine

## 2016-05-29 ENCOUNTER — Other Ambulatory Visit: Payer: Self-pay | Admitting: Family Medicine

## 2016-05-29 DIAGNOSIS — E785 Hyperlipidemia, unspecified: Secondary | ICD-10-CM

## 2016-05-29 LAB — COMPREHENSIVE METABOLIC PANEL
ALBUMIN: 4 g/dL (ref 3.5–4.7)
ALK PHOS: 94 IU/L (ref 39–117)
ALT: 19 IU/L (ref 0–32)
AST: 20 IU/L (ref 0–40)
Albumin/Globulin Ratio: 1.4 (ref 1.2–2.2)
BILIRUBIN TOTAL: 0.2 mg/dL (ref 0.0–1.2)
BUN/Creatinine Ratio: 27 (ref 12–28)
BUN: 20 mg/dL (ref 8–27)
CHLORIDE: 102 mmol/L (ref 96–106)
CO2: 24 mmol/L (ref 18–29)
CREATININE: 0.73 mg/dL (ref 0.57–1.00)
Calcium: 9.7 mg/dL (ref 8.7–10.3)
GFR calc Af Amer: 84 mL/min/{1.73_m2} (ref >59)
GFR calc non Af Amer: 73 mL/min/{1.73_m2} (ref >59)
GLUCOSE: 93 mg/dL (ref 65–99)
Globulin, Total: 2.9 g/dL (ref 1.5–4.5)
Potassium: 4.6 mmol/L (ref 3.5–5.2)
Sodium: 142 mmol/L (ref 134–144)
Total Protein: 6.9 g/dL (ref 6.0–8.5)

## 2016-05-30 LAB — URINE CULTURE

## 2016-06-18 ENCOUNTER — Ambulatory Visit (INDEPENDENT_AMBULATORY_CARE_PROVIDER_SITE_OTHER): Payer: Medicare Other | Admitting: Podiatry

## 2016-06-18 DIAGNOSIS — M25571 Pain in right ankle and joints of right foot: Secondary | ICD-10-CM | POA: Diagnosis not present

## 2016-06-18 DIAGNOSIS — M7751 Other enthesopathy of right foot: Secondary | ICD-10-CM | POA: Diagnosis not present

## 2016-06-18 DIAGNOSIS — M659 Synovitis and tenosynovitis, unspecified: Secondary | ICD-10-CM

## 2016-06-24 NOTE — Progress Notes (Signed)
   Subjective:  Patient presents today stating that she has had right foot pain for approximately 2 weeks now. Upon presentation she states the pain is now gone. She no longer experiences the symptoms that she's had of the past 2 weeks. Patient does have an unsteady gait and is diabetic.    Objective / Physical Exam:  General:  The patient is alert and oriented x3 in no acute distress. Dermatology:  Skin is warm, dry and supple bilateral lower extremities. Negative for open lesions or macerations. Vascular:  Palpable pedal pulses bilaterally. No edema or erythema noted. Capillary refill within normal limits. Neurological:  Epicritic and protective threshold grossly intact bilaterally.  Musculoskeletal Exam:  Pain on palpation to the medial aspect of the patient's left ankle. Mild edema noted.  Range of motion within normal limits to all pedal and ankle joints bilateral. Muscle strength 5/5 in all groups bilateral.   Assessment: #1 pain in right ankle #2 synovitis of right ankle #3 capsulitis of right ankle  Plan of Care:  #1 Patient was evaluated. #2 recommend good conservative shoe gear with arch supports. #3 return to clinic when necessary  Edrick Kins, DPM [facility]  Dr. Edrick Kins, [address]                                        [place], [place] [address]                Office [phone]  Fax [phone]

## 2016-12-01 ENCOUNTER — Other Ambulatory Visit: Payer: Self-pay | Admitting: Family Medicine

## 2016-12-01 DIAGNOSIS — E785 Hyperlipidemia, unspecified: Secondary | ICD-10-CM

## 2017-01-03 ENCOUNTER — Other Ambulatory Visit: Payer: Self-pay | Admitting: Family Medicine

## 2017-01-03 DIAGNOSIS — E785 Hyperlipidemia, unspecified: Secondary | ICD-10-CM

## 2017-01-03 NOTE — Telephone Encounter (Signed)
Will call patient about getting apt for med refills

## 2017-01-03 NOTE — Telephone Encounter (Signed)
Call -- refill approved for 30 day supply only of pravastatin and diclofenac as patient is overdue for six month follow-up of high blood pressure and diabetes with dr. Brigitte Pulse.  Please schedule follow-up with Brigitte Pulse in upcoming month.

## 2017-01-29 ENCOUNTER — Other Ambulatory Visit: Payer: Self-pay | Admitting: Family Medicine

## 2017-01-29 DIAGNOSIS — E785 Hyperlipidemia, unspecified: Secondary | ICD-10-CM

## 2017-02-04 ENCOUNTER — Other Ambulatory Visit: Payer: Self-pay | Admitting: Family Medicine

## 2017-02-04 DIAGNOSIS — E785 Hyperlipidemia, unspecified: Secondary | ICD-10-CM

## 2017-02-04 NOTE — Telephone Encounter (Signed)
Call --- Pravastatin refill denied; patient is OVERDUE for six month follow-up with Dr. Brigitte Pulse.  Please schedule follow-up visit with Dr. Brigitte Pulse ASAP.

## 2017-02-07 ENCOUNTER — Telehealth: Payer: Self-pay | Admitting: Family Medicine

## 2017-02-07 NOTE — Telephone Encounter (Signed)
lmom for pt to schedule an OV with Brigitte Pulse for med refill

## 2017-03-04 ENCOUNTER — Ambulatory Visit (INDEPENDENT_AMBULATORY_CARE_PROVIDER_SITE_OTHER): Payer: Medicare Other | Admitting: Family Medicine

## 2017-03-04 ENCOUNTER — Encounter: Payer: Self-pay | Admitting: Family Medicine

## 2017-03-04 ENCOUNTER — Other Ambulatory Visit: Payer: Self-pay

## 2017-03-04 VITALS — BP 138/70 | HR 89 | Temp 97.4°F | Resp 16 | Ht 63.0 in | Wt 209.6 lb

## 2017-03-04 DIAGNOSIS — Z6838 Body mass index (BMI) 38.0-38.9, adult: Secondary | ICD-10-CM | POA: Diagnosis not present

## 2017-03-04 DIAGNOSIS — E119 Type 2 diabetes mellitus without complications: Secondary | ICD-10-CM | POA: Diagnosis not present

## 2017-03-04 DIAGNOSIS — E785 Hyperlipidemia, unspecified: Secondary | ICD-10-CM

## 2017-03-04 DIAGNOSIS — Z23 Encounter for immunization: Secondary | ICD-10-CM

## 2017-03-04 DIAGNOSIS — I10 Essential (primary) hypertension: Secondary | ICD-10-CM

## 2017-03-04 DIAGNOSIS — Z5181 Encounter for therapeutic drug level monitoring: Secondary | ICD-10-CM

## 2017-03-04 DIAGNOSIS — F0390 Unspecified dementia without behavioral disturbance: Secondary | ICD-10-CM | POA: Diagnosis not present

## 2017-03-04 LAB — POCT URINALYSIS DIP (MANUAL ENTRY)
Glucose, UA: NEGATIVE mg/dL
Ketones, POC UA: NEGATIVE mg/dL
NITRITE UA: NEGATIVE
PH UA: 5.5 (ref 5.0–8.0)
Protein Ur, POC: NEGATIVE mg/dL
Spec Grav, UA: >=1.03 — AB (ref 1.010–1.025)
Urobilinogen, UA: 0.2 E.U./dL

## 2017-03-04 LAB — POCT GLYCOSYLATED HEMOGLOBIN (HGB A1C): HEMOGLOBIN A1C: 6.5

## 2017-03-04 MED ORDER — AMLODIPINE BESYLATE 2.5 MG PO TABS: 2.5000 mg | ORAL_TABLET | Freq: Every day | ORAL | 3 refills | Status: DC

## 2017-03-04 MED ORDER — DICLOFENAC SODIUM 50 MG PO TBEC: DELAYED_RELEASE_TABLET | ORAL | 1 refills | Status: DC

## 2017-03-04 NOTE — Progress Notes (Signed)
Subjective:    Patient ID: Anne Rice, female    DOB: Dec 30, 1924, 83 y.o.   MRN: [id number]  Chief Complaint  Patient presents with  . Follow-up    TIIDM    HPI Anne Rice is an 83 yo woman here today to follow-up on her chronic medical conditions.  Her main caretaker is her son Anne Rice with whom she lives. She does not drive.  Her son lays out her med box for her so she is not aware of her medications.  Pre-DM: Has been diet controlled. Takes an asa 81 mg qd. Last yr a1c 6.3->6.2. Sees [facility] regularly- she just had bilateral eye surgery to correct cloudy vision and vision is normal. Urine microalb nml 03/2014 HPL: On pravastatin 10. Last year LDL 123, total 211, non-hdl 149. Not fasting today nor at her prior visit so has not had lipid panel in well > 1 yr.  HTN: On amlodipine 2.5 - occ  Checks at [facility] occ.  No med side effects.  Arthralgias: On oral voltaren 50mg  prn - usually needs daily but will try skipping to see if it makes a difference. - top voltaren 1% gel is no longer needed  Demented but very mild and overall doing well so no desire for further eval now. Left handed Hard of hearing and has not wanted audiology eval or hearing aides.  Has seen cardiology several times.   Increased wobbly when walking. No falls, balance fine.   120/78  She takes the diclofenac every morning. - do trial off - for shoulder - right  Will do trial of the voltaren. . Normal urine and bowels.  Always sleeps on left side, occ left eye squint. t  Past Medical History:  Diagnosis Date  . Depression   . Murmur 12/06/2015   Past Surgical History:  Procedure Laterality Date  . ABDOMINAL HYSTERECTOMY    . APPENDECTOMY     Current Outpatient Medications on File Prior to Visit  Medication Sig Dispense Refill  . amLODipine (NORVASC) 2.5 MG tablet Take 1 tablet (2.5 mg total) by mouth daily. 90 tablet 3  . aspirin EC 81 MG tablet Take 81 mg by mouth daily. Pt taking 2 tablets  daily     . diclofenac (VOLTAREN) 50 MG EC tablet TAKE 1 TABLET BY MOUTH EVERY DAY AS NEEDED FOR MODERATE PAIN 30 tablet 0  . pravastatin (PRAVACHOL) 10 MG tablet TAKE 1 TABLET (10 MG TOTAL) BY MOUTH DAILY. OFFICE VISIT NEEDED FOR 90 DAY SUPPLY 30 tablet 0   No current facility-administered medications on file prior to visit.    No Known Allergies Family History  Problem Relation Age of Onset  . Parkinson's disease Father   . Dementia Brother    Social History   Socioeconomic History  . Marital status: Unknown    Spouse name: Not on file  . Number of children: Not on file  . Years of education: Not on file  . Highest education level: Not on file  Social Needs  . Financial resource strain: Not on file  . Food insecurity - worry: Not on file  . Food insecurity - inability: Not on file  . Transportation needs - medical: Not on file  . Transportation needs - non-medical: Not on file  Occupational History  . Not on file  Tobacco Use  . Smoking status: Former Smoker    Last attempt to quit: 04/24/1967    Years since quitting: 49.8  . Smokeless tobacco: Never  Used  Substance and Sexual Activity  . Alcohol use: No  . Drug use: No  . Sexual activity: No  Other Topics Concern  . Not on file  Social History Narrative  . Not on file   Depression screen [facility] 2/9 03/04/2017 05/28/2016 10/27/2015 04/08/2015 10/18/2014  Decreased Interest 0 0 0 0 0  Down, Depressed, Hopeless 0 0 0 0 0  PHQ - 2 Score 0 0 0 0 0    Review of Systems See hpi    Objective:   Physical Exam  Constitutional: She is oriented to person, place, and time. She appears well-developed and well-nourished. No distress.  HENT:  Head: Normocephalic and atraumatic.  Right Ear: External ear normal.  Left Ear: External ear normal.  Eyes: Conjunctivae are normal. No scleral icterus.  Neck: Normal range of motion. Neck supple. No thyromegaly present.  Cardiovascular: Normal rate, regular rhythm and intact distal pulses.    Murmur heard.  Systolic murmur is present with a grade of 2/6. Pulmonary/Chest: Effort normal and breath sounds normal. No respiratory distress.  Musculoskeletal: She exhibits no edema.  Lymphadenopathy:    She has no cervical adenopathy.  Neurological: She is alert and oriented to person, place, and time.  Skin: Skin is warm and dry. She is not diaphoretic. No erythema.  Psychiatric: She has a normal mood and affect. Her behavior is normal.   BP 138/70   Pulse 89   Temp (!) 97.4 F (36.3 C)   Resp 16   Ht 5\' 3"  (1.6 m)   Wt 209 lb 9.6 oz (95.1 kg)   SpO2 95%   BMI 37.13 kg/m      Diabetic Foot Exam - Simple   Simple Foot Form Diabetic Foot exam was performed with the following findings:  Yes 03/04/2017  5:31 PM  Visual Inspection No deformities, no ulcerations, no other skin breakdown bilaterally:  Yes Sensation Testing Intact to touch and monofilament testing bilaterally:  Yes Pulse Check Posterior Tibialis and Dorsalis pulse intact bilaterally:  Yes Comments      Assessment & Plan:  Needs foot exam  1. Type 2 diabetes mellitus without complication, without long-term current use of insulin ([place])   2. Essential hypertension   3. Hyperlipidemia LDL goal <100   4. Class 2 severe obesity due to excess calories with serious comorbidity and body mass index (BMI) of 38.0 to 38.9 in adult ([place])   5. Dementia without behavioral disturbance, unspecified dementia type   6. Medication monitoring encounter   7. Need for prophylactic vaccination and inoculation against influenza   8. Dyslipidemia     Orders Placed This Encounter  Procedures  . Flu Vaccine QUAD 6+ mos PF IM (Fluarix Quad PF)  . Comprehensive metabolic panel  . Microalbumin/Creatinine Ratio, Urine  . Lipid panel    Order Specific Question:   Has the patient fasted?    Answer:   Yes  . Specimen status report  . POCT glycosylated hemoglobin (Hb A1C)  . POCT urinalysis dipstick    Meds ordered this  encounter  Medications  . amLODipine (NORVASC) 2.5 MG tablet    Sig: Take 1 tablet (2.5 mg total) daily by mouth.    Dispense:  90 tablet    Refill:  3  . diclofenac (VOLTAREN) 50 MG EC tablet    Sig: TAKE 1 TABLET BY MOUTH EVERY DAY AS NEEDED FOR MODERATE PAIN    Dispense:  90 tablet    Refill:  1  Delman Cheadle, M.D.  Primary Care at [facility] [address] [place], [place] [address] [phone] phone [phone] fax  03/04/17 3:02 PM

## 2017-03-04 NOTE — Patient Instructions (Addendum)
Try stopping the diclofenac AND the pravastatin.  If you are having more pain, restart the diclofenac.  Recheck with me in another 6-9 months FASTING - we will check your cholesterol again at that time to see if we think you need to restart.  If you are having much more shortness of breath and swelling in the legs, we can get you back to the cardiologist but as long as you are not, ok not to return for recheck at this time.    IF you received an x-ray today, you will receive an invoice from [facility]. Please contact [facility] at [phone] with questions or concerns regarding your invoice.   IF you received labwork today, you will receive an invoice from [place]. Please contact [facility] at [phone] with questions or concerns regarding your invoice.   Our billing staff will not be able to assist you with questions regarding bills from these companies.  You will be contacted with the lab results as soon as they are available. The fastest way to get your results is to activate your My Chart account. Instructions are located on the last page of this paperwork. If you have not heard from [place] regarding the results in 2 weeks, please contact this office.       Prediabetes Eating Plan Prediabetes-also called impaired glucose tolerance or impaired fasting glucose-is a condition that causes blood sugar (blood glucose) levels to be higher than normal. Following a healthy diet can help to keep prediabetes under control. It can also help to lower the risk of type 2 diabetes and heart disease, which are increased in people who have prediabetes. Along with regular exercise, a healthy diet:  Promotes weight loss.  Helps to control blood sugar levels.  Helps to improve the way that the body uses insulin.  What do I need to know about this eating plan?  Use the glycemic index (GI) to plan your meals. The index tells you how quickly a food will raise your blood sugar. Choose  low-GI foods. These foods take a longer time to raise blood sugar.  Pay close attention to the amount of carbohydrates in the food that you eat. Carbohydrates increase blood sugar levels.  Keep track of how many calories you take in. Eating the right amount of calories will help you to achieve a healthy weight. Losing about 7 percent of your starting weight can help to prevent type 2 diabetes.  You may want to follow a Mediterranean diet. This diet includes a lot of vegetables, lean meats or fish, whole grains, fruits, and healthy oils and fats. What foods can I eat? Grains Whole grains, such as whole-wheat or whole-grain breads, crackers, cereals, and pasta. Unsweetened oatmeal. Bulgur. Barley. Quinoa. Brown rice. Corn or whole-wheat flour tortillas or taco shells. Vegetables Lettuce. Spinach. Peas. Beets. Cauliflower. Cabbage. Broccoli. Carrots. Tomatoes. Squash. Eggplant. Herbs. Peppers. Onions. Cucumbers. Brussels sprouts. Fruits Berries. Bananas. Apples. Oranges. Grapes. Papaya. Mango. Pomegranate. Kiwi. Grapefruit. Cherries. Meats and Other Protein Sources Seafood. Lean meats, such as chicken and [place] or lean cuts of pork and beef. Tofu. Eggs. Nuts. Beans. Dairy Low-fat or fat-free dairy products, such as yogurt, cottage cheese, and cheese. Beverages Water. Tea. Coffee. Sugar-free or diet soda. Seltzer water. Milk. Milk alternatives, such as soy or almond milk. Condiments Mustard. Relish. Low-fat, low-sugar ketchup. Low-fat, low-sugar barbecue sauce. Low-fat or fat-free mayonnaise. Sweets and Desserts Sugar-free or low-fat pudding. Sugar-free or low-fat ice cream and other frozen treats. Fats and Oils Avocado. Walnuts. Olive  oil. The items listed above may not be a complete list of recommended foods or beverages. Contact your dietitian for more options. What foods are not recommended? Grains Refined white flour and flour products, such as bread, pasta, snack foods, and  cereals. Beverages Sweetened drinks, such as sweet iced tea and soda. Sweets and Desserts Baked goods, such as cake, cupcakes, pastries, cookies, and cheesecake. The items listed above may not be a complete list of foods and beverages to avoid. Contact your dietitian for more information. This information is not intended to replace advice given to you by your health care provider. Make sure you discuss any questions you have with your health care provider. Document Released: 08/24/2014 Document Revised: 09/15/2015 Document Reviewed: 05/05/2014 Elsevier Interactive Patient Education  2017 [place] refers to food and lifestyle choices that are based on the traditions of countries located on the [employer]. This way of eating has been shown to help prevent certain conditions and improve outcomes for people who have chronic diseases, like kidney disease and heart disease. What are tips for following this plan? Lifestyle  Cook and eat meals together with your family, when possible.  Drink enough fluid to keep your urine clear or pale yellow.  Be physically active every day. This includes: ? Aerobic exercise like running or swimming. ? Leisure activities like gardening, walking, or housework.  Get 7-8 hours of sleep each night.  If recommended by your health care provider, drink red wine in moderation. This means 1 glass a day for nonpregnant women and 2 glasses a day for men. A glass of wine equals 5 oz (150 mL). Reading food labels  Check the serving size of packaged foods. For foods such as rice and pasta, the serving size refers to the amount of cooked product, not dry.  Check the total fat in packaged foods. Avoid foods that have saturated fat or trans fats.  Check the ingredients list for added sugars, such as corn syrup. Shopping  At the grocery store, buy most of your food from the areas near the walls of the store. This  includes: ? Fresh fruits and vegetables (produce). ? Grains, beans, nuts, and seeds. Some of these may be available in unpackaged forms or large amounts (in bulk). ? Fresh seafood. ? Poultry and eggs. ? Low-fat dairy products.  Buy whole ingredients instead of prepackaged foods.  Buy fresh fruits and vegetables in-season from local farmers markets.  Buy frozen fruits and vegetables in resealable bags.  If you do not have access to quality fresh seafood, buy precooked frozen shrimp or canned fish, such as tuna, salmon, or sardines.  Buy small amounts of raw or cooked vegetables, salads, or olives from the deli or salad bar at your store.  Stock your pantry so you always have certain foods on hand, such as olive oil, canned tuna, canned tomatoes, rice, pasta, and beans. Cooking  Cook foods with extra-virgin olive oil instead of using butter or other vegetable oils.  Have meat as a side dish, and have vegetables or grains as your main dish. This means having meat in small portions or adding small amounts of meat to foods like pasta or stew.  Use beans or vegetables instead of meat in common dishes like chili or lasagna.  Experiment with different cooking methods. Try roasting or broiling vegetables instead of steaming or sauteing them.  Add frozen vegetables to soups, stews, pasta, or rice.  Add nuts or seeds  for added healthy fat at each meal. You can add these to yogurt, salads, or vegetable dishes.  Marinate fish or vegetables using olive oil, lemon juice, garlic, and fresh herbs. Meal planning  Plan to eat 1 vegetarian meal one day each week. Try to work up to 2 vegetarian meals, if possible.  Eat seafood 2 or more times a week.  Have healthy snacks readily available, such as: ? Vegetable sticks with hummus. ? [place] yogurt. ? Fruit and nut trail mix.  Eat balanced meals throughout the week. This includes: ? Fruit: 2-3 servings a day ? Vegetables: 4-5 servings a  day ? Low-fat dairy: 2 servings a day ? Fish, poultry, or lean meat: 1 serving a day ? Beans and legumes: 2 or more servings a week ? Nuts and seeds: 1-2 servings a day ? Whole grains: 6-8 servings a day ? Extra-virgin olive oil: 3-4 servings a day  Limit red meat and sweets to only a few servings a month What are my food choices?  Mediterranean diet ? Recommended ? Grains: Whole-grain pasta. Brown rice. Bulgar wheat. Polenta. Couscous. Whole-wheat bread. [place]. ? Vegetables: Artichokes. Beets. Broccoli. Cabbage. Carrots. Eggplant. Green beans. Chard. Kale. Spinach. Onions. Leeks. Peas. Squash. Tomatoes. Peppers. Radishes. ? Fruits: Apples. Apricots. Avocado. Berries. Bananas. Cherries. Dates. Figs. Grapes. Lemons. Melon. Oranges. Peaches. Plums. Pomegranate. ? Meats and other protein foods: Beans. Almonds. Sunflower seeds. Pine nuts. Peanuts. [place]. Salmon. Scallops. Shrimp. [place]. Tilapia. Clams. Oysters. Eggs. ? Dairy: Low-fat milk. Cheese. Greek yogurt. ? Beverages: Water. Red wine. Herbal tea. ? Fats and oils: Extra virgin olive oil. Avocado oil. Grape seed oil. ? Sweets and desserts: [place] yogurt with honey. Baked apples. Poached pears. Trail mix. ? Seasoning and other foods: Basil. Cilantro. Coriander. Cumin. Mint. Parsley. Sage. Rosemary. Tarragon. Garlic. Oregano. Thyme. Pepper. Balsalmic vinegar. Tahini. Hummus. Tomato sauce. Olives. Mushrooms. ? Limit these ? Grains: Prepackaged pasta or rice dishes. Prepackaged cereal with added sugar. ? Vegetables: Deep fried potatoes (french fries). ? Fruits: Fruit canned in syrup. ? Meats and other protein foods: Beef. Pork. Lamb. Poultry with skin. Hot dogs. [place] Salines. ? Dairy: Ice cream. Sour cream. Whole milk. ? Beverages: Juice. Sugar-sweetened soft drinks. Beer. Liquor and spirits. ? Fats and oils: Butter. Canola oil. Vegetable oil. Beef fat (tallow). Lard. ? Sweets and desserts: Cookies. Cakes. Pies. Candy. ? Seasoning and other  foods: Mayonnaise. Premade sauces and marinades. ? The items listed may not be a complete list. Talk with your dietitian about what dietary choices are right for you. Summary  The Mediterranean diet includes both food and lifestyle choices.  Eat a variety of fresh fruits and vegetables, beans, nuts, seeds, and whole grains.  Limit the amount of red meat and sweets that you eat.  Talk with your health care provider about whether it is safe for you to drink red wine in moderation. This means 1 glass a day for nonpregnant women and 2 glasses a day for men. A glass of wine equals 5 oz (150 mL). This information is not intended to replace advice given to you by your health care provider. Make sure you discuss any questions you have with your health care provider. Document Released: 12/01/2015 Document Revised: 01/03/2016 Document Reviewed: 12/01/2015 Elsevier Interactive Patient Education  [employer].    Why follow it? Research shows. . Those who follow the Mediterranean diet have a reduced risk of heart disease  . The diet is associated with a reduced incidence of Parkinson's and Alzheimer's  diseases . People following the diet may have longer life expectancies and lower rates of chronic diseases  . The Dietary Guidelines for Americans recommends the Mediterranean diet as an eating plan to promote health and prevent disease  What Is the Mediterranean Diet?  . Healthy eating plan based on typical foods and recipes of Mediterranean-style cooking . The diet is primarily a plant based diet; these foods should make up a majority of meals   Starches - Plant based foods should make up a majority of meals - They are an important sources of vitamins, minerals, energy, antioxidants, and fiber - Choose whole grains, foods high in fiber and minimally processed items  - Typical grain sources include wheat, oats, barley, corn, brown rice, bulgar, farro, millet, polenta, couscous  - Various types of  beans include chickpeas, lentils, fava beans, black beans, white beans   Fruits  Veggies - Large quantities of antioxidant rich fruits & veggies; 6 or more servings  - Vegetables can be eaten raw or lightly drizzled with oil and cooked  - Vegetables common to the traditional Mediterranean Diet include: artichokes, arugula, beets, broccoli, brussel sprouts, cabbage, carrots, celery, collard greens, cucumbers, eggplant, kale, leeks, lemons, lettuce, mushrooms, okra, onions, peas, peppers, potatoes, pumpkin, radishes, rutabaga, shallots, spinach, sweet potatoes, turnips, zucchini - Fruits common to the Mediterranean Diet include: apples, apricots, avocados, cherries, clementines, dates, figs, grapefruits, grapes, melons, nectarines, oranges, peaches, pears, pomegranates, strawberries, tangerines  Fats - Replace butter and margarine with healthy oils, such as olive oil, canola oil, and tahini  - Limit nuts to no more than a handful a day  - Nuts include walnuts, almonds, pecans, pistachios, pine nuts  - Limit or avoid candied, honey roasted or heavily salted nuts - Olives are central to the [employer] - can be eaten whole or used in a variety of dishes   Meats Protein - Limiting red meat: no more than a few times a month - When eating red meat: choose lean cuts and keep the portion to the size of deck of cards - Eggs: approx. 0 to 4 times a week  - Fish and lean poultry: at least 2 a week  - Healthy protein sources include, chicken, [place], lean beef, lamb - Increase intake of seafood such as tuna, salmon, trout, mackerel, shrimp, scallops - Avoid or limit high fat processed meats such as sausage and bacon  Dairy - Include moderate amounts of low fat dairy products  - Focus on healthy dairy such as fat free yogurt, skim milk, low or reduced fat cheese - Limit dairy products higher in fat such as whole or 2% milk, cheese, ice cream  Alcohol - Moderate amounts of red wine is ok  - No more than  5 oz daily for women (all ages) and men older than age 45  - No more than 10 oz of wine daily for men younger than 7  Other - Limit sweets and other desserts  - Use herbs and spices instead of salt to flavor foods  - Herbs and spices common to the traditional Mediterranean Diet include: basil, bay leaves, chives, cloves, cumin, fennel, garlic, lavender, marjoram, mint, oregano, parsley, pepper, rosemary, sage, savory, sumac, tarragon, thyme   It's not just a diet, it's a lifestyle:  . The Mediterranean diet includes lifestyle factors typical of those in the region  . Foods, drinks and meals are best eaten with others and savored . Daily physical activity is important for overall good health .  This could be strenuous exercise like running and aerobics . This could also be more leisurely activities such as walking, housework, yard-work, or taking the stairs . Moderation is the key; a balanced and healthy diet accommodates most foods and drinks . Consider portion sizes and frequency of consumption of certain foods   Meal Ideas & Options:  . Breakfast:  o Whole wheat toast or whole wheat English muffins with peanut butter & hard boiled egg o Steel cut oats topped with apples & cinnamon and skim milk  o Fresh fruit: banana, strawberries, melon, berries, peaches  o Smoothies: strawberries, bananas, greek yogurt, peanut butter o Low fat greek yogurt with blueberries and granola  o Egg white omelet with spinach and mushrooms o Breakfast couscous: whole wheat couscous, apricots, skim milk, cranberries  . Sandwiches:  o Hummus and grilled vegetables (peppers, zucchini, squash) on whole wheat bread   o Grilled chicken on whole wheat pita with lettuce, tomatoes, cucumbers or tzatziki  o Tuna salad on whole wheat bread: tuna salad made with greek yogurt, olives, red peppers, capers, green onions o Garlic rosemary lamb pita: lamb sauted with garlic, rosemary, salt & pepper; add lettuce, cucumber,  greek yogurt to pita - flavor with lemon juice and black pepper  . Seafood:  o Mediterranean grilled salmon, seasoned with garlic, basil, parsley, lemon juice and black pepper o Shrimp, lemon, and spinach whole-grain pasta salad made with low fat greek yogurt  o Seared scallops with lemon orzo  o Seared tuna steaks seasoned salt, pepper, coriander topped with tomato mixture of olives, tomatoes, olive oil, minced garlic, parsley, green onions and cappers  . Meats:  o Herbed greek chicken salad with kalamata olives, cucumber, feta  o Red bell peppers stuffed with spinach, bulgur, lean ground beef (or lentils) & topped with feta   o Kebabs: skewers of chicken, tomatoes, onions, zucchini, squash  o [place] burgers: made with red onions, mint, dill, lemon juice, feta cheese topped with roasted red peppers . Vegetarian o Cucumber salad: cucumbers, artichoke hearts, celery, red onion, feta cheese, tossed in olive oil & lemon juice  o Hummus and whole grain pita points with a greek salad (lettuce, tomato, feta, olives, cucumbers, red onion) o Lentil soup with celery, carrots made with vegetable broth, garlic, salt and pepper  o Tabouli salad: parsley, bulgur, mint, scallions, cucumbers, tomato, radishes, lemon juice, olive oil, salt and pepper.

## 2017-03-05 LAB — LIPID PANEL
CHOL/HDL RATIO: 3.3 ratio (ref 0.0–4.4)
CHOLESTEROL TOTAL: 234 mg/dL — AB (ref 100–199)
HDL: 71 mg/dL (ref >39)
LDL CALC: 134 mg/dL — AB (ref 0–99)
TRIGLYCERIDES: 145 mg/dL (ref 0–149)
VLDL Cholesterol Cal: 29 mg/dL (ref 5–40)

## 2017-03-05 LAB — COMPREHENSIVE METABOLIC PANEL
ALBUMIN: 4.6 g/dL (ref 3.2–4.6)
ALK PHOS: 96 IU/L (ref 39–117)
ALT: 25 IU/L (ref 0–32)
AST: 25 IU/L (ref 0–40)
Albumin/Globulin Ratio: 1.5 (ref 1.2–2.2)
BUN / CREAT RATIO: 28 (ref 12–28)
BUN: 24 mg/dL (ref 10–36)
Bilirubin Total: 0.5 mg/dL (ref 0.0–1.2)
CALCIUM: 10.4 mg/dL — AB (ref 8.7–10.3)
CO2: 21 mmol/L (ref 20–29)
CREATININE: 0.85 mg/dL (ref 0.57–1.00)
Chloride: 99 mmol/L (ref 96–106)
GFR calc Af Amer: 70 mL/min/{1.73_m2} (ref >59)
GFR calc non Af Amer: 61 mL/min/{1.73_m2} (ref >59)
GLOBULIN, TOTAL: 3.1 g/dL (ref 1.5–4.5)
GLUCOSE: 120 mg/dL — AB (ref 65–99)
Potassium: 4.5 mmol/L (ref 3.5–5.2)
SODIUM: 138 mmol/L (ref 134–144)
TOTAL PROTEIN: 7.7 g/dL (ref 6.0–8.5)

## 2017-03-05 LAB — MICROALBUMIN / CREATININE URINE RATIO
CREATININE, UR: 196.3 mg/dL
Microalb/Creat Ratio: 5.6 mg/g creat (ref 0.0–30.0)
Microalbumin, Urine: 10.9 ug/mL

## 2017-03-05 LAB — SPECIMEN STATUS REPORT

## 2017-08-26 ENCOUNTER — Other Ambulatory Visit: Payer: Self-pay

## 2017-08-26 ENCOUNTER — Encounter: Payer: Self-pay | Admitting: Physician Assistant

## 2017-08-26 ENCOUNTER — Ambulatory Visit: Payer: Medicare Other | Admitting: Physician Assistant

## 2017-08-26 VITALS — BP 120/68 | HR 90 | Temp 97.5°F | Ht 63.0 in | Wt 194.2 lb

## 2017-08-26 DIAGNOSIS — H02402 Unspecified ptosis of left eyelid: Secondary | ICD-10-CM | POA: Diagnosis not present

## 2017-08-26 NOTE — Patient Instructions (Addendum)
Please use a warm wash cloth to clean the eye 2-3 times a day. Please schedule appointment with eye doctor next week    IF you received an x-ray today, you will receive an invoice from [facility]. Please contact [facility] at [phone] with questions or concerns regarding your invoice.   IF you received labwork today, you will receive an invoice from [place]. Please contact [facility] at [phone] with questions or concerns regarding your invoice.   Our billing staff will not be able to assist you with questions regarding bills from these companies.  You will be contacted with the lab results as soon as they are available. The fastest way to get your results is to activate your My Chart account. Instructions are located on the last page of this paperwork. If you have not heard from [place] regarding the results in 2 weeks, please contact this office.

## 2017-08-26 NOTE — Progress Notes (Signed)
    08/26/2017 3:22 PM   DOB: 1925-03-02 / MRN: [id number]  SUBJECTIVE:  Anne Rice is a 84 y.o. female presenting for left-sided lid drooping.  Her son is with her today and deliveries the history.  States this is been going on for months now.  Denies any lip throat tongue swelling and changes in appetite.  Denies frank exudate from the eye.  No changes in vision and no frank eye pain.  She has No Known Allergies.   She  has a past medical history of Depression and Murmur (12/06/2015).    She  reports that she quit smoking about 50 years ago. She has never used smokeless tobacco. She reports that she does not drink alcohol or use drugs. She  reports that she does not engage in sexual activity. The patient  has a past surgical history that includes Appendectomy and Abdominal hysterectomy.  Her family history includes Dementia in her brother; Parkinson's disease in her father.  Review of Systems  Constitutional: Negative for chills, diaphoresis and fever.  Eyes: Negative.  Negative for blurred vision, double vision, photophobia, pain, discharge and redness.  Respiratory: Negative for cough, hemoptysis, sputum production, shortness of breath and wheezing.   Cardiovascular: Negative for chest pain.  Gastrointestinal: Negative for abdominal pain, blood in stool, constipation, diarrhea, heartburn, melena, nausea and vomiting.  Genitourinary: Negative for flank pain.  Skin: Negative for rash.  Neurological: Negative for dizziness, sensory change, speech change, focal weakness and headaches.    The problem list and medications were reviewed and updated by myself where necessary and exist elsewhere in the encounter.   OBJECTIVE:  BP 120/68 (BP Location: Left Arm, Patient Position: Sitting, Cuff Size: Large)   Pulse 90   Temp (!) 97.5 F (36.4 C) (Oral)   Ht 5\' 3"  (1.6 m)   Wt 194 lb 3.2 oz (88.1 kg)   SpO2 96%   BMI 34.40 kg/m   Physical Exam  Constitutional: She is oriented to  person, place, and time. She appears well-nourished. No distress.  Eyes: Pupils are equal, round, and reactive to light. EOM are normal. Right eye exhibits no discharge. Left eye exhibits no discharge. No scleral icterus.    Cardiovascular: Normal rate.  Pulmonary/Chest: Effort normal.  Abdominal: She exhibits no distension.  Neurological: She is alert and oriented to person, place, and time. No cranial nerve deficit. Gait normal.  Skin: Skin is dry. She is not diaphoretic.  Psychiatric: She has a normal mood and affect.  Vitals reviewed.   No results found for this or any previous visit (from the past 72 hour(s)).  No results found.  ASSESSMENT AND PLAN:  Anne Rice was seen today for eye problem.  Diagnoses and all orders for this visit:  Blepharoptosis, left Comments: Chronic. No red flags. No need for abx.  She will see opthomology in about 1 week. Okay to wait.     The patient is advised to call or return to clinic if she does not see an improvement in symptoms, or to seek the care of the closest emergency department if she worsens with the above plan.   Philis Fendt, MHS, PA-C [facility] 08/26/2017 3:22 PM

## 2017-08-28 ENCOUNTER — Other Ambulatory Visit: Payer: Self-pay | Admitting: Family Medicine

## 2017-08-29 NOTE — Telephone Encounter (Signed)
LOV  03/04/17 Dr. Brigitte Pulse Last refill  03/04/17  #90  With 1 refill

## 2017-09-18 ENCOUNTER — Encounter: Payer: Self-pay | Admitting: Family Medicine

## 2018-02-21 ENCOUNTER — Other Ambulatory Visit: Payer: Self-pay | Admitting: Family Medicine

## 2018-02-21 NOTE — Telephone Encounter (Signed)
Requested Prescriptions  Pending Prescriptions Disp Refills  . diclofenac (VOLTAREN) 50 MG EC tablet [Pharmacy Med Name: DICLOFENAC SOD EC 50 MG TAB] 90 tablet 1    Sig: TAKE 1 TABLET BY MOUTH EVERY DAY AS NEEDED FOR MODERATE PAIN     Analgesics:  NSAIDS Failed - 02/21/2018  2:03 AM      Failed - HGB in normal range and within 360 days    Hemoglobin  Date Value Ref Range Status  01/11/2014 14.2 12.0 - 15.0 g/dL Final         Passed - Cr in normal range and within 360 days    Creat  Date Value Ref Range Status  10/27/2015 0.74 0.60 - 0.88 mg/dL Final    Comment:      For patients > or = 82 years of age: The upper reference limit for Creatinine is approximately 13% higher for people identified as African-American.      Creatinine, Ser  Date Value Ref Range Status  03/04/2017 0.85 0.57 - 1.00 mg/dL Final         Passed - Patient is not pregnant      Passed - Valid encounter within last 12 months    Recent Outpatient Visits          5 months ago Blepharoptosis, left   [facility] [place], PA-C   11 months ago Type 2 diabetes mellitus without complication, without long-term current use of insulin [facility])   [facility], Laurey Arrow, MD   1 year ago Type 2 diabetes mellitus without complication, without long-term current use of insulin [facility])   [facility], Laurey Arrow, MD   2 years ago Type 2 diabetes mellitus without complication, without long-term current use of insulin [facility])   [facility], Laurey Arrow, MD   2 years ago Cellulitis of eyelid, left   [facility] at [facility], Gay Filler, MD

## 2018-05-22 ENCOUNTER — Other Ambulatory Visit: Payer: Self-pay | Admitting: Family Medicine

## 2018-05-22 NOTE — Telephone Encounter (Signed)
Requested medication (s) are due for refill today:  yes  Requested medication (s) are on the active medication list:  yes  Future visit scheduled:  no  Last Refill: 03/04/17; # 90; RF x 3  *Rx expired    Requested Prescriptions  Pending Prescriptions Disp Refills   amLODipine (NORVASC) 2.5 MG tablet [Pharmacy Med Name: AMLODIPINE BESYLATE 2.5 MG TAB] 90 tablet 3    Sig: Take 1 tablet (2.5 mg total) daily by mouth.     Cardiovascular:  Calcium Channel Blockers Failed - 05/22/2018  1:46 AM      Failed - Valid encounter within last 6 months    Recent Outpatient Visits          8 months ago Blepharoptosis, left   [facility] [place], PA-C   1 year ago Type 2 diabetes mellitus without complication, without long-term current use of insulin [facility])   [facility], Laurey Arrow, MD   1 year ago Type 2 diabetes mellitus without complication, without long-term current use of insulin [facility])   [facility], Laurey Arrow, MD   2 years ago Type 2 diabetes mellitus without complication, without long-term current use of insulin [facility])   [facility], Laurey Arrow, MD   3 years ago Cellulitis of eyelid, left   [facility], Gay Filler, MD             Passed - Last BP in normal range    BP Readings from Last 1 Encounters:  08/26/17 120/68

## 2018-07-08 ENCOUNTER — Telehealth: Payer: Self-pay | Admitting: Family Medicine

## 2018-07-08 NOTE — Telephone Encounter (Signed)
Copied from [place] [id number]. Topic: Quick Communication - Rx Refill/Question >> Jul 08, 2018  5:15 PM Alanda Slim E wrote: Medication: amLODipine (NORVASC) 2.5 MG tablet diclofenac (VOLTAREN) 50 MG EC tablet aspirin EC 81 MG tablet   Pt has scheduled a TOC for 5.7.20/ Pt is 89 and doesn't want to be exposed to illness but is in need of refill/ please advise   Has the patient contacted their pharmacy? Yes - call PCP   Preferred Pharmacy (with phone number or street name): [id number] - [place], [place] [phone] (Phone) [phone] (Fax)    Agent: Please be advised that RX refills may take up to 3 business days. We ask that you follow-up with your pharmacy.

## 2018-07-09 ENCOUNTER — Other Ambulatory Visit: Payer: Self-pay

## 2018-07-09 MED ORDER — DICLOFENAC SODIUM 50 MG PO TBEC: DELAYED_RELEASE_TABLET | ORAL | 0 refills | Status: DC

## 2018-07-09 MED ORDER — AMLODIPINE BESYLATE 2.5 MG PO TABS: 2.5000 mg | ORAL_TABLET | Freq: Every day | ORAL | 0 refills | Status: DC

## 2018-07-09 NOTE — Telephone Encounter (Signed)
90 day supply sent in 

## 2018-08-28 ENCOUNTER — Telehealth: Payer: Self-pay | Admitting: Family Medicine

## 2018-08-28 ENCOUNTER — Encounter: Payer: Medicare Other | Admitting: Family Medicine

## 2018-08-28 ENCOUNTER — Encounter: Payer: Self-pay | Admitting: Family Medicine

## 2018-08-28 ENCOUNTER — Telehealth (INDEPENDENT_AMBULATORY_CARE_PROVIDER_SITE_OTHER): Payer: Medicare Other | Admitting: Family Medicine

## 2018-08-28 ENCOUNTER — Other Ambulatory Visit: Payer: Self-pay

## 2018-08-28 DIAGNOSIS — E785 Hyperlipidemia, unspecified: Secondary | ICD-10-CM

## 2018-08-28 DIAGNOSIS — E119 Type 2 diabetes mellitus without complications: Secondary | ICD-10-CM

## 2018-08-28 DIAGNOSIS — I10 Essential (primary) hypertension: Secondary | ICD-10-CM

## 2018-08-28 MED ORDER — DICLOFENAC SODIUM 50 MG PO TBEC: DELAYED_RELEASE_TABLET | ORAL | 0 refills | Status: DC

## 2018-08-28 MED ORDER — AMLODIPINE BESYLATE 2.5 MG PO TABS: 2.5000 mg | ORAL_TABLET | Freq: Every day | ORAL | 0 refills | Status: DC

## 2018-08-28 NOTE — Telephone Encounter (Signed)
Called this pt to let them know Dr.Santiago was wanting to schedule AWV for pt/ Pt son said ok to call but wants to know if this type of appt is really  necessary  FR

## 2018-08-28 NOTE — Progress Notes (Signed)
Virtual Visit Note  I connected with patient and her son, Anne Rice, on 08/28/18 at 34am by phone and verified that I am speaking with the correct person using two identifiers. Anne Rice is currently located at home and patient is currently with them during visit. The provider, Rutherford Guys, MD is located in their office at time of visit.  I discussed the limitations, risks, security and privacy concerns of performing an evaluation and management service by telephone and the availability of in person appointments. I also discussed with the patient that there may be a patient responsible charge related to this service. The patient expressed understanding and agreed to proceed.   CC: TOC  HPI ? 85 yo F with PMH of DM2 diet controlled, HTN, HLP, OA of left shoulder, mild dementia, hard hearing, s/p cataract surgery  Previous PCP Dr Brigitte Pulse Last OV in May 2019  Main historian is her son Anne Rice, main caregiver, with whom she lives Left shoulder pain well controlled with diclofenac 50mg  once a day Takes amlodipine 2.5mg  once a day for HTN Prior to covid 19, she was walking lots, around [place], park Was going to [employer], now staying exclusively at home Has been eating better/less, has lost about 10 lbs Has been sleeping a bit more since being inside all the time Does not check BP at home Has not had any falls recently  Cognition is stable, able to bath herself There are grab bars in the bathroom Denies any urine incontinence Denies any constipation Denies any dizziness, lightheaded, cp, sob, palpitations, edema, nausea, vomiting, abd pain Needs refills of medications  Wt Readings from Last 3 Encounters:  08/26/17 194 lb 3.2 oz (88.1 kg)  03/04/17 209 lb 9.6 oz (95.1 kg)  05/28/16 214 lb (97.1 kg)   BP Readings from Last 3 Encounters:  08/26/17 120/68  03/04/17 138/70  05/28/16 (!) 142/80   Lab Results  Component Value Date   HGBA1C 6.5 03/04/2017   HGBA1C 6.3  05/28/2016   HGBA1C 6.2 10/27/2015   Lab Results  Component Value Date   MICROALBUR 3.4 10/27/2015   LDLCALC 134 (H) 03/04/2017   CREATININE 0.85 03/04/2017    No Known Allergies  Prior to Admission medications   Medication Sig Start Date End Date Taking? Authorizing Provider  amLODipine (NORVASC) 2.5 MG tablet Take 1 tablet (2.5 mg total) by mouth daily. 07/09/18  Yes Delia Chimes A, MD  aspirin EC 81 MG tablet Take 81 mg by mouth daily.    Yes [provider]  diclofenac (VOLTAREN) 50 MG EC tablet Take 1 tablet by mouth everyday as needed for moderate pain. 07/09/18  Yes Forrest Moron, MD    Past Medical History:  Diagnosis Date  . Depression   . Murmur 12/06/2015    Past Surgical History:  Procedure Laterality Date  . ABDOMINAL HYSTERECTOMY    . APPENDECTOMY      Social History   Tobacco Use  . Smoking status: Former Smoker    Last attempt to quit: 04/24/1967    Years since quitting: 51.3  . Smokeless tobacco: Never Used  Substance Use Topics  . Alcohol use: No    Family History  Problem Relation Age of Onset  . Parkinson's disease Father   . Dementia Brother     ROS Per hpi  Objective  Vitals as reported by the patient: none   ASSESSMENT and PLAN  1. Type 2 diabetes mellitus without complication, without long-term current use of  insulin (HCC) - Hemoglobin A1c; Future - Comprehensive metabolic panel; Future  2. Essential hypertension - CBC; Future - TSH; Future  3. Hyperlipidemia LDL goal <100 - Lipid panel; Future  Other orders - diclofenac (VOLTAREN) 50 MG EC tablet; Take 1 tablet by mouth everyday as needed for moderate pain. - amLODipine (NORVASC) 2.5 MG tablet; Take 1 tablet (2.5 mg total) by mouth daily.  Patient has been stable. There are no acute concerns. Will defer labs until next visit given high risk for COVID 19 complications. In the meanwhile refilling medications. RTC precautions reviewed.   FOLLOW-UP: 3 months, AWV,  fasting labs 2-3 days prior appt   The above assessment and management plan was discussed with the patient. The patient verbalized understanding of and has agreed to the management plan. Patient is aware to call the clinic if symptoms persist or worsen. Patient is aware when to return to the clinic for a follow-up visit. Patient educated on when it is appropriate to go to the emergency department.    I provided 25 minutes of non-face-to-face time during this encounter.  Rutherford Guys, MD [facility] at [place], [place] [address] Ph.  [phone] Fax [phone]

## 2018-08-28 NOTE — Progress Notes (Signed)
Pt c/o CPE and TOC from Dr. Brigitte Pulse. Had a rash under breast but son says it is all cleared up now.

## 2018-12-01 ENCOUNTER — Other Ambulatory Visit: Payer: Self-pay

## 2018-12-01 ENCOUNTER — Ambulatory Visit (INDEPENDENT_AMBULATORY_CARE_PROVIDER_SITE_OTHER): Payer: Medicare Other | Admitting: Family Medicine

## 2018-12-01 DIAGNOSIS — E119 Type 2 diabetes mellitus without complications: Secondary | ICD-10-CM | POA: Diagnosis not present

## 2018-12-01 DIAGNOSIS — I10 Essential (primary) hypertension: Secondary | ICD-10-CM | POA: Diagnosis not present

## 2018-12-01 DIAGNOSIS — E785 Hyperlipidemia, unspecified: Secondary | ICD-10-CM

## 2018-12-02 LAB — HEMOGLOBIN A1C
Est. average glucose Bld gHb Est-mCnc: 126 mg/dL
Hgb A1c MFr Bld: 6 % — ABNORMAL HIGH (ref 4.8–5.6)

## 2018-12-02 LAB — COMPREHENSIVE METABOLIC PANEL
ALT: 15 IU/L (ref 0–32)
AST: 18 IU/L (ref 0–40)
Albumin/Globulin Ratio: 1.7 (ref 1.2–2.2)
Albumin: 4 g/dL (ref 3.5–4.6)
Alkaline Phosphatase: 89 IU/L (ref 39–117)
BUN/Creatinine Ratio: 26 (ref 12–28)
BUN: 18 mg/dL (ref 10–36)
Bilirubin Total: 0.5 mg/dL (ref 0.0–1.2)
CO2: 21 mmol/L (ref 20–29)
Calcium: 10.1 mg/dL (ref 8.7–10.3)
Chloride: 104 mmol/L (ref 96–106)
Creatinine, Ser: 0.68 mg/dL (ref 0.57–1.00)
GFR calc Af Amer: 88 mL/min/{1.73_m2} (ref >59)
GFR calc non Af Amer: 77 mL/min/{1.73_m2} (ref >59)
Globulin, Total: 2.3 g/dL (ref 1.5–4.5)
Glucose: 92 mg/dL (ref 65–99)
Potassium: 4.2 mmol/L (ref 3.5–5.2)
Sodium: 140 mmol/L (ref 134–144)
Total Protein: 6.3 g/dL (ref 6.0–8.5)

## 2018-12-02 LAB — LIPID PANEL
Chol/HDL Ratio: 4.3 ratio (ref 0.0–4.4)
Cholesterol, Total: 243 mg/dL — ABNORMAL HIGH (ref 100–199)
HDL: 56 mg/dL (ref >39)
LDL Calculated: 161 mg/dL — ABNORMAL HIGH (ref 0–99)
Triglycerides: 131 mg/dL (ref 0–149)
VLDL Cholesterol Cal: 26 mg/dL (ref 5–40)

## 2018-12-02 LAB — CBC
Hematocrit: 40.2 % (ref 34.0–46.6)
Hemoglobin: 13.4 g/dL (ref 11.1–15.9)
MCH: 30.9 pg (ref 26.6–33.0)
MCHC: 33.3 g/dL (ref 31.5–35.7)
MCV: 93 fL (ref 79–97)
Platelets: 227 10*3/uL (ref 150–450)
RBC: 4.34 x10E6/uL (ref 3.77–5.28)
RDW: 12.5 % (ref 11.7–15.4)
WBC: 10 10*3/uL (ref 3.4–10.8)

## 2018-12-02 LAB — TSH: TSH: 3.53 u[IU]/mL (ref 0.450–4.500)

## 2018-12-04 ENCOUNTER — Encounter: Payer: Self-pay | Admitting: Family Medicine

## 2018-12-04 ENCOUNTER — Ambulatory Visit (INDEPENDENT_AMBULATORY_CARE_PROVIDER_SITE_OTHER): Payer: Medicare Other | Admitting: Family Medicine

## 2018-12-04 ENCOUNTER — Other Ambulatory Visit: Payer: Self-pay | Admitting: Family Medicine

## 2018-12-04 ENCOUNTER — Other Ambulatory Visit: Payer: Self-pay

## 2018-12-04 VITALS — BP 126/84 | HR 125 | Temp 98.6°F | Ht 63.0 in | Wt 179.0 lb

## 2018-12-04 DIAGNOSIS — H6123 Impacted cerumen, bilateral: Secondary | ICD-10-CM

## 2018-12-04 DIAGNOSIS — I10 Essential (primary) hypertension: Secondary | ICD-10-CM | POA: Diagnosis not present

## 2018-12-04 DIAGNOSIS — F0390 Unspecified dementia without behavioral disturbance: Secondary | ICD-10-CM | POA: Diagnosis not present

## 2018-12-04 DIAGNOSIS — Z23 Encounter for immunization: Secondary | ICD-10-CM

## 2018-12-04 DIAGNOSIS — E1169 Type 2 diabetes mellitus with other specified complication: Secondary | ICD-10-CM | POA: Diagnosis not present

## 2018-12-04 DIAGNOSIS — R000 Tachycardia, unspecified: Secondary | ICD-10-CM | POA: Diagnosis not present

## 2018-12-04 DIAGNOSIS — E119 Type 2 diabetes mellitus without complications: Secondary | ICD-10-CM | POA: Diagnosis not present

## 2018-12-04 DIAGNOSIS — E785 Hyperlipidemia, unspecified: Secondary | ICD-10-CM

## 2018-12-04 MED ORDER — AMLODIPINE BESYLATE 2.5 MG PO TABS: 2.5000 mg | ORAL_TABLET | Freq: Every day | ORAL | 1 refills | Status: DC

## 2018-12-04 MED ORDER — DICLOFENAC SODIUM 50 MG PO TBEC: DELAYED_RELEASE_TABLET | ORAL | 1 refills | Status: DC

## 2018-12-04 NOTE — Progress Notes (Signed)
8/13/20208:13 AM  Anne Rice 1924/07/07, 85 y.o., female [id number]  Chief Complaint  Patient presents with  . Hypertension    HPI:   Patient is a 85 y.o. female with past medical history significant for DM2 diet controlled, HTN, HLP, OA of left shoulder, mild dementia, hard hearing, s/p cataract surgery who presents today for routine followup  Last OV May 2020 - telemedicine [name], son and main caregiver She is very hard of hearing Son provides most of history No acute concerns - he states that she gets very nervous when she comes to the doctor Prior to covid she was very active Now has become sedentary Son denies any behavioral concerns, states her cognition remains stable Requesting ears to be lavaged, has needed lavage in the past   Lab Results  Component Value Date   HGBA1C 6.0 (H) 12/01/2018   Lab Results  Component Value Date   CHOL 243 (H) 12/01/2018   HDL 56 12/01/2018   LDLCALC 161 (H) 12/01/2018   TRIG 131 12/01/2018   CHOLHDL 4.3 12/01/2018   Lab Results  Component Value Date   WBC 10.0 12/01/2018   HGB 13.4 12/01/2018   HCT 40.2 12/01/2018   MCV 93 12/01/2018   PLT 227 12/01/2018   Lab Results  Component Value Date   TSH 3.530 12/01/2018   Lab Results  Component Value Date   CREATININE 0.68 12/01/2018   BUN 18 12/01/2018   NA 140 12/01/2018   K 4.2 12/01/2018   CL 104 12/01/2018   CO2 21 12/01/2018   Lab Results  Component Value Date   ALT 15 12/01/2018   AST 18 12/01/2018   ALKPHOS 89 12/01/2018   BILITOT 0.5 12/01/2018    Depression screen PHQ 2/9 12/04/2018 08/28/2018 08/26/2017  Decreased Interest 0 0 0  Down, Depressed, Hopeless 0 0 0  PHQ - 2 Score 0 0 0    Fall Risk  12/04/2018 08/28/2018 08/26/2017 03/04/2017 05/28/2016  Falls in the past year? 0 0 No No No  Number falls in past yr: 0 - - - -  Injury with Fall? 0 - - - -  Risk for fall due to : Impaired balance/gait;Impaired mobility;Mental status change - - - -     No  Known Allergies  Prior to Admission medications   Medication Sig Start Date End Date Taking? Authorizing Provider  amLODipine (NORVASC) 2.5 MG tablet Take 1 tablet (2.5 mg total) by mouth daily. 08/28/18  Yes Rutherford Guys, MD  aspirin EC 81 MG tablet Take 81 mg by mouth daily.    Yes [provider]  diclofenac (VOLTAREN) 50 MG EC tablet Take 1 tablet by mouth everyday as needed for moderate pain. 08/28/18  Yes Rutherford Guys, MD    Past Medical History:  Diagnosis Date  . Depression   . Murmur 12/06/2015    Past Surgical History:  Procedure Laterality Date  . ABDOMINAL HYSTERECTOMY    . APPENDECTOMY      Social History   Tobacco Use  . Smoking status: Former Smoker    Quit date: 04/24/1967    Years since quitting: 51.6  . Smokeless tobacco: Never Used  Substance Use Topics  . Alcohol use: No    Family History  Problem Relation Age of Onset  . Parkinson's disease Father   . Dementia Brother     Review of Systems  Constitutional: Negative for chills and fever.  Respiratory: Negative for cough and shortness of breath.  Cardiovascular: Negative for chest pain, palpitations and leg swelling.  Gastrointestinal: Negative for abdominal pain, nausea and vomiting.     OBJECTIVE:  Today's Vitals   12/04/18 0805  BP: 126/84  Pulse: (!) 125  Temp: 98.6 F (37 C)  SpO2: 96%  Weight: 179 lb (81.2 kg)  Height: 5\' 3"  (1.6 m)   Body mass index is 31.71 kg/m.  Wt Readings from Last 3 Encounters:  12/04/18 179 lb (81.2 kg)  08/26/17 194 lb 3.2 oz (88.1 kg)  03/04/17 209 lb 9.6 oz (95.1 kg)    Physical Exam Vitals signs and nursing note reviewed.  Constitutional:      Appearance: She is well-developed.  HENT:     Head: Normocephalic and atraumatic.     Right Ear: Hearing, tympanic membrane, ear canal and external ear normal.     Left Ear: Hearing, tympanic membrane, ear canal and external ear normal.  Eyes:     Conjunctiva/sclera: Conjunctivae normal.      Pupils: Pupils are equal, round, and reactive to light.  Neck:     Musculoskeletal: Neck supple.  Cardiovascular:     Rate and Rhythm: Regular rhythm. Tachycardia present.     Heart sounds: Normal heart sounds. No murmur. No friction rub. No gallop.   Pulmonary:     Effort: Pulmonary effort is normal.     Breath sounds: Normal breath sounds. No wheezing or rales.  Musculoskeletal:     Right lower leg: No edema.     Left lower leg: No edema.  Lymphadenopathy:     Cervical: No cervical adenopathy.  Skin:    General: Skin is warm and dry.  Neurological:     Mental Status: She is alert and oriented to person, place, and time.  Psychiatric:        Mood and Affect: Mood is anxious.    My interpretation of EKG:  NSR, HR 97  ASSESSMENT and PLAN  1. Type 2 diabetes mellitus without complication, without long-term current use of insulin (HCC) Diet controlled.   2. Essential hypertension Controlled. Continue current regime.   3. Hyperlipidemia associated with type 2 diabetes mellitus LDL elevated however acceptable for age  21. Dementia without behavioral disturbance, unspecified dementia type (HCC) Stable. No behavioral concerns. Lives with son.  5. Bilateral impacted cerumen - Ear wax removal - done by cma, tolerated well  6. Need for vaccination - Pneumococcal polysaccharide vaccine 23-valent greater than or equal to 2yo subcutaneous/IM  7. Tachycardia Sinus, most likely secondary to anxiety.  Other orders - amLODipine (NORVASC) 2.5 MG tablet; Take 1 tablet (2.5 mg total) by mouth daily. - diclofenac (VOLTAREN) 50 MG EC tablet; Take 1 tablet by mouth everyday as needed for moderate pain.  Return in about 6 months (around 06/06/2019).    Rutherford Guys, MD Primary Care at [place], [place] [address] Ph.  [phone] Fax [phone]

## 2018-12-04 NOTE — Telephone Encounter (Signed)
Forwarding medication refill requests to the clinical pool for review. 

## 2018-12-04 NOTE — Patient Instructions (Signed)
° ° ° °  If you have lab work done today you will be contacted with your lab results within the next 2 weeks.  If you have not heard from us then please contact us. The fastest way to get your results is to register for My Chart. ° ° °IF you received an x-ray today, you will receive an invoice from [facility]. Please contact [facility] at [phone] with questions or concerns regarding your invoice.  ° °IF you received labwork today, you will receive an invoice from [facility]. Please contact [facility] at [phone] with questions or concerns regarding your invoice.  ° °Our billing staff will not be able to assist you with questions regarding bills from these companies. ° °You will be contacted with the lab results as soon as they are available. The fastest way to get your results is to activate your My Chart account. Instructions are located on the last page of this paperwork. If you have not heard from us regarding the results in 2 weeks, please contact this office. °  ° ° ° °

## 2019-05-18 ENCOUNTER — Ambulatory Visit: Payer: Medicare Other | Attending: Internal Medicine

## 2019-05-18 DIAGNOSIS — Z23 Encounter for immunization: Secondary | ICD-10-CM | POA: Insufficient documentation

## 2019-05-18 NOTE — Progress Notes (Signed)
   Covid-19 Vaccination Clinic  Name:  Anne Rice    MRN: [id number] DOB: 1925-02-03  05/18/2019  [name] was observed post Covid-19 immunization for 15 minutes without incidence. She was provided with Vaccine Information Sheet and instruction to access the V-Safe system.   [name] was instructed to call 911 with any severe reactions post vaccine: Marland Kitchen Difficulty breathing  . Swelling of your face and throat  . A fast heartbeat  . A bad rash all over your body  . Dizziness and weakness    Immunizations Administered    Name Date Dose VIS Date Route   Pfizer COVID-19 Vaccine 05/18/2019  8:38 AM 0.3 mL 04/03/2019 Intramuscular   Manufacturer: [place]   Lot: [id number]   [place]: [id number]

## 2019-05-27 ENCOUNTER — Ambulatory Visit: Payer: Self-pay

## 2019-06-02 ENCOUNTER — Other Ambulatory Visit: Payer: Self-pay | Admitting: Family Medicine

## 2019-06-02 NOTE — Telephone Encounter (Signed)
Requested Prescriptions  Pending Prescriptions Disp Refills  . amLODipine (NORVASC) 2.5 MG tablet [Pharmacy Med Name: AMLODIPINE BESYLATE 2.5 MG TAB] 90 tablet 1    Sig: TAKE 1 TABLET BY MOUTH EVERY DAY     Cardiovascular:  Calcium Channel Blockers Passed - 06/02/2019  1:09 AM      Passed - Last BP in normal range    BP Readings from Last 1 Encounters:  12/04/18 126/84         Passed - Valid encounter within last 6 months    Recent Outpatient Visits          6 months ago Type 2 diabetes mellitus without complication, without long-term current use of insulin ([place])   [facility], Lilia Argue, MD   6 months ago Essential hypertension   [facility], Lilia Argue, MD   9 months ago Type 2 diabetes mellitus without complication, without long-term current use of insulin [facility])   [facility], Lilia Argue, MD   1 year ago Blepharoptosis, left   [facility] at [place], PA-C   2 years ago Type 2 diabetes mellitus without complication, without long-term current use of insulin [facility])   [facility], Laurey Arrow, MD      Future Appointments            In 6 days Rutherford Guys, MD Primary Care at [facility], [facility]           . diclofenac (VOLTAREN) 50 MG EC tablet [Pharmacy Med Name: DICLOFENAC SOD EC 50 MG TAB] 90 tablet 1    Sig: TAKE 1 TABLET BY MOUTH EVERYDAY AS NEEDED FOR MODERATE PAIN.     Analgesics:  NSAIDS Passed - 06/02/2019  1:09 AM      Passed - Cr in normal range and within 360 days    Creat  Date Value Ref Range Status  10/27/2015 0.74 0.60 - 0.88 mg/dL Final    Comment:      For patients > or = 84 years of age: The upper reference limit for Creatinine is approximately 13% higher for people identified as African-American.      Creatinine, Ser  Date Value Ref Range Status  12/01/2018 0.68 0.57 - 1.00 mg/dL Final   Creatinine, Urine  Date Value Ref Range Status  10/27/2015 210 20 - 320 mg/dL Final         Passed - HGB in normal range and within 360 days    Hemoglobin  Date Value Ref Range Status  12/01/2018 13.4 11.1 - 15.9 g/dL Final         Passed - Patient is not pregnant      Passed - Valid encounter within last 12 months    Recent Outpatient Visits          6 months ago Type 2 diabetes mellitus without complication, without long-term current use of insulin ([place])   [facility], Lilia Argue, MD   6 months ago Essential hypertension   [facility], Lilia Argue, MD   9 months ago Type 2 diabetes mellitus without complication, without long-term current use of insulin [facility])   [facility], Lilia Argue, MD   1 year ago Blepharoptosis, left   [facility] at [place], PA-C   2 years ago Type 2 diabetes mellitus without complication, without long-term current use of insulin ([place])  Primary [facility] at [place], MD      Future Appointments            In 6 days Rutherford Guys, MD Primary Care at [place], [facility]

## 2019-06-08 ENCOUNTER — Ambulatory Visit: Payer: Medicare Other | Attending: Internal Medicine

## 2019-06-08 ENCOUNTER — Ambulatory Visit: Payer: Medicare Other | Admitting: Family Medicine

## 2019-06-08 DIAGNOSIS — Z23 Encounter for immunization: Secondary | ICD-10-CM | POA: Insufficient documentation

## 2019-06-08 NOTE — Progress Notes (Signed)
   Covid-19 Vaccination Clinic  Name:  Anne Rice    MRN: [id number] DOB: 17-Nov-1924  06/08/2019  [name] was observed post Covid-19 immunization for 15 minutes without incidence. She was provided with Vaccine Information Sheet and instruction to access the V-Safe system.   [name] was instructed to call 911 with any severe reactions post vaccine: Marland Kitchen Difficulty breathing  . Swelling of your face and throat  . A fast heartbeat  . A bad rash all over your body  . Dizziness and weakness    Immunizations Administered    Name Date Dose VIS Date Route   Pfizer COVID-19 Vaccine 06/08/2019  9:19 AM 0.3 mL 04/03/2019 Intramuscular   Manufacturer: [place]   Lot: [id number]   [place]: [id number]

## 2019-07-09 NOTE — Telephone Encounter (Signed)
completed

## 2019-11-27 ENCOUNTER — Other Ambulatory Visit: Payer: Self-pay | Admitting: Family Medicine

## 2019-11-27 NOTE — Telephone Encounter (Signed)
Attempted to call patient's son to schedule appointment- left message to call office. Voltaren RF per protocol. Courtesy RF #30 given for amlodipine

## 2019-12-22 ENCOUNTER — Other Ambulatory Visit: Payer: Self-pay | Admitting: Family Medicine

## 2019-12-22 NOTE — Telephone Encounter (Signed)
Requested Prescriptions  Pending Prescriptions Disp Refills  . amLODipine (NORVASC) 2.5 MG tablet [Pharmacy Med Name: AMLODIPINE BESYLATE 2.5 MG TAB] 30 tablet 0    Sig: TAKE 1 TABLET BY MOUTH EVERY DAY     Cardiovascular:  Calcium Channel Blockers Failed - 12/22/2019 11:31 AM      Failed - Valid encounter within last 6 months    Recent Outpatient Visits          1 year ago Type 2 diabetes mellitus without complication, without long-term current use of insulin ([place])   [facility], Lilia Argue, MD   1 year ago Essential hypertension   [facility], Lilia Argue, MD   1 year ago Type 2 diabetes mellitus without complication, without long-term current use of insulin [facility])   [facility], Lilia Argue, MD   2 years ago Blepharoptosis, left   [facility] at [place], PA-C   2 years ago Type 2 diabetes mellitus without complication, without long-term current use of insulin [facility])   [facility], Laurey Arrow, MD      Future Appointments            In 3 weeks Rutherford Guys, MD [facility] [place], [place] BP in normal range    BP Readings from Last 1 Encounters:  12/04/18 126/84          Phone call to pt's son; transferred to a Scheduler for f/u appt.; sched. Appt. 01/14/20 w/ PCP.  Courtesy refill, #30, given at this time.

## 2019-12-22 NOTE — Telephone Encounter (Signed)
Phone call to pt's son, [name].  Stated he was aware of the need to schedule an appt. For the pt.  Transferred to the Scheduler to make an appt.

## 2019-12-22 NOTE — Telephone Encounter (Signed)
Pt. Has appt. Scheduled 01/14/20 w/ PCP.  Will give courtesy refill, #30, at this time.

## 2019-12-23 ENCOUNTER — Other Ambulatory Visit: Payer: Self-pay | Admitting: Family Medicine

## 2020-01-14 ENCOUNTER — Encounter: Payer: Self-pay | Admitting: Family Medicine

## 2020-01-14 ENCOUNTER — Other Ambulatory Visit: Payer: Self-pay

## 2020-01-14 ENCOUNTER — Ambulatory Visit (INDEPENDENT_AMBULATORY_CARE_PROVIDER_SITE_OTHER): Payer: Medicare Other | Admitting: Family Medicine

## 2020-01-14 VITALS — BP 140/87 | HR 88 | Temp 98.3°F | Ht 63.0 in | Wt 188.0 lb

## 2020-01-14 DIAGNOSIS — E785 Hyperlipidemia, unspecified: Secondary | ICD-10-CM | POA: Diagnosis not present

## 2020-01-14 DIAGNOSIS — E1169 Type 2 diabetes mellitus with other specified complication: Secondary | ICD-10-CM

## 2020-01-14 DIAGNOSIS — I10 Essential (primary) hypertension: Secondary | ICD-10-CM | POA: Diagnosis not present

## 2020-01-14 DIAGNOSIS — E119 Type 2 diabetes mellitus without complications: Secondary | ICD-10-CM | POA: Diagnosis not present

## 2020-01-14 LAB — POCT GLYCOSYLATED HEMOGLOBIN (HGB A1C): Hemoglobin A1C: 5.7 % — AB (ref 4.0–5.6)

## 2020-01-14 MED ORDER — AMLODIPINE BESYLATE 2.5 MG PO TABS
2.5000 mg | ORAL_TABLET | Freq: Every day | ORAL | 1 refills | Status: DC
Start: 2020-01-14 — End: 2020-01-15

## 2020-01-14 NOTE — Progress Notes (Signed)
9/23/20219:11 AM  Anne Rice 10-Jul-1924, 86 y.o., female [id number]  Chief Complaint  Patient presents with  . Diabetes  . Hypertension    HPI:   Patient is a 86 y.o. female with past medical history significant for DM2 diet controlled, HTN, HLP, OA of left shoulder, mild dementia, hard hearing, s/p cataract surgery who presents today for routine followup  Last OV aug 2020  Comes with her son, Anne Rice, who is main caregiver She is overall doing well She has no acute concerns today She has completed covid vaccine  Wt Readings from Last 3 Encounters:  01/14/20 188 lb (85.3 kg)  12/04/18 179 lb (81.2 kg)  08/26/17 194 lb 3.2 oz (88.1 kg)   BP Readings from Last 3 Encounters:  01/14/20 140/87  12/04/18 126/84  08/26/17 120/68    Lab Results  Component Value Date   HGBA1C 6.0 (H) 12/01/2018   HGBA1C 6.5 03/04/2017   HGBA1C 6.3 05/28/2016   Lab Results  Component Value Date   MICROALBUR 3.4 10/27/2015   LDLCALC 161 (H) 12/01/2018   CREATININE 0.68 12/01/2018    Depression screen PHQ 2/9 12/04/2018 08/28/2018 08/26/2017  Decreased Interest 0 0 0  Down, Depressed, Hopeless 0 0 0  PHQ - 2 Score 0 0 0    Fall Risk  01/14/2020 12/04/2018 08/28/2018 08/26/2017 03/04/2017  Falls in the past year? 0 0 0 No No  Number falls in past yr: 0 0 - - -  Injury with Fall? 0 0 - - -  Risk for fall due to : - Impaired balance/gait;Impaired mobility;Mental status change - - -     No Known Allergies  Prior to Admission medications   Medication Sig Start Date End Date Taking? Authorizing Provider  amLODipine (NORVASC) 2.5 MG tablet TAKE 1 TABLET BY MOUTH EVERY DAY 12/22/19  Yes Rutherford Guys, MD  aspirin EC 81 MG tablet Take 81 mg by mouth daily.    Yes [provider]  diclofenac (VOLTAREN) 50 MG EC tablet TAKE 1 TABLET BY MOUTH EVERYDAY AS NEEDED FOR MODERATE PAIN. 11/27/19  Yes Rutherford Guys, MD    Past Medical History:  Diagnosis Date  . Depression   . Murmur  12/06/2015    Past Surgical History:  Procedure Laterality Date  . ABDOMINAL HYSTERECTOMY    . APPENDECTOMY      Social History   Tobacco Use  . Smoking status: Former Smoker    Quit date: 04/24/1967    Years since quitting: 52.7  . Smokeless tobacco: Never Used  Substance Use Topics  . Alcohol use: No    Family History  Problem Relation Age of Onset  . Parkinson's disease Father   . Dementia Brother     Review of Systems  Constitutional: Negative for chills and fever.  Eyes: Negative for blurred vision and double vision.  Respiratory: Negative for cough and shortness of breath.   Cardiovascular: Negative for chest pain, palpitations and leg swelling.  Gastrointestinal: Negative for abdominal pain, nausea and vomiting.  Genitourinary: Negative for dysuria and hematuria.     OBJECTIVE:  Today's Vitals   01/14/20 0858  BP: 140/87  Pulse: 88  Temp: 98.3 F (36.8 C)  SpO2: 100%  Weight: 188 lb (85.3 kg)  Height: $Remove'5\' 3"'SHWBdDM$  (1.6 m)   Body mass index is 33.3 kg/m.   Physical Exam Vitals and nursing note reviewed.  Constitutional:      Appearance: She is well-developed.  HENT:  Head: Normocephalic and atraumatic.     Mouth/Throat:     Pharynx: No oropharyngeal exudate.  Eyes:     General: No scleral icterus.    Extraocular Movements: Extraocular movements intact.     Conjunctiva/sclera: Conjunctivae normal.     Pupils: Pupils are equal, round, and reactive to light.  Cardiovascular:     Rate and Rhythm: Normal rate and regular rhythm.     Heart sounds: Normal heart sounds. No murmur heard.  No friction rub. No gallop.   Pulmonary:     Effort: Pulmonary effort is normal.     Breath sounds: Normal breath sounds. No wheezing, rhonchi or rales.  Musculoskeletal:     Cervical back: Neck supple.     Right lower leg: No edema.     Left lower leg: No edema.  Skin:    General: Skin is warm and dry.  Neurological:     Mental Status: She is alert and oriented  to person, place, and time.      Diabetic Foot Form - Detailed   Diabetic Foot Exam - detailed Can the patient see the bottom of their feet?: Yes Are the shoes appropriate in style and fit?: Yes Is there swelling or and abnormal foot shape?: No Is there a claw toe deformity?: No Is there elevated skin temparature?: No Is there foot or ankle muscle weakness?: No Normal Range of Motion: No Semmes-Weinstein Monofilament Test R Site 1-Great Toe: Neg L Site 1-Great Toe: Neg        No results found for this or any previous visit (from the past 24 hour(s)).  No results found.   ASSESSMENT and PLAN  1. Type 2 diabetes mellitus without complication, without long-term current use of insulin (HCC) Diet controlled. Labs pending. Normal foot exam. Discussed activity. - CMP14+EGFR - POCT glycosylated hemoglobin (Hb A1C) - HM DIABETES FOOT EXAM  2. Essential hypertension Controlled. Continue current regime.  - CMP14+EGFR  3. Hyperlipidemia associated with type 2 diabetes mellitus (HCC) Stable.  - CMP14+EGFR  High dose flu vaccine not available in clinic today, she will get from her pharmacy  Return in about 6 months (around 07/13/2020).    Rutherford Guys, MD Primary Care at [place], [place] [address] Ph.  [phone] Fax [phone]

## 2020-01-14 NOTE — Patient Instructions (Addendum)
Please get high dose flu vaccine at your pharmacy  Please take small walks within the house at least 2-3 times a day   Exercises To Do While Sitting  Exercises that you do while sitting (chair exercises) can give you many of the same benefits as full exercise. Benefits include strengthening your heart, burning calories, and keeping muscles and joints healthy. Exercise can also improve your mood and help with depression and anxiety. You may benefit from chair exercises if you are unable to do standing exercises because of:  Diabetic foot pain.  Obesity.  Illness.  Arthritis.  Recovery from surgery or injury.  Breathing problems.  Balance problems.  Another type of disability. Before starting chair exercises, check with your health care provider or a physical therapist to find out how much exercise you can tolerate and which exercises are safe for you. If your health care provider approves:  Start out slowly and build up over time. Aim to work up to about 10-20 minutes for each exercise session.  Make exercise part of your daily routine.  Drink water when you exercise. Do not wait until you are thirsty. Drink every 10-15 minutes.  Stop exercising right away if you have pain, nausea, shortness of breath, or dizziness.  If you are exercising in a wheelchair, make sure to lock the wheels.  Ask your health care provider whether you can do tai chi or yoga. Many positions in these mind-body exercises can be modified to do while seated. Warm-up Before starting other exercises: 1. Sit up as straight as you can. Have your knees bent at 90 degrees, which is the shape of the capital letter "L." Keep your feet flat on the floor. 2. Sit at the front edge of your chair, if you can. 3. Pull in (tighten) the muscles in your abdomen and stretch your spine and neck as straight as you can. Hold this position for a few minutes. 4. Breathe in and out evenly. Try to concentrate on your breathing,  and relax your mind. Stretching Exercise A: Arm stretch 1. Hold your arms out straight in front of your body. 2. Bend your hands at the wrist with your fingers pointing up, as if signaling someone to stop. Notice the slight tension in your forearms as you hold the position. 3. Keeping your arms out and your hands bent, rotate your hands outward as far as you can and hold this stretch. Aim to have your thumbs pointing up and your pinkie fingers pointing down. Slowly repeat arm stretches for one minute as tolerated. Exercise B: Leg stretch 1. If you can move your legs, try to "draw" letters on the floor with the toes of your foot. Write your name with one foot. 2. Write your name with the toes of your other foot. Slowly repeat the movements for one minute as tolerated. Exercise C: Reach for the sky 1. Reach your hands as far over your head as you can to stretch your spine. 2. Move your hands and arms as if you are climbing a rope. Slowly repeat the movements for one minute as tolerated. Range of motion exercises Exercise A: Shoulder roll 1. Let your arms hang loosely at your sides. 2. Lift just your shoulders up toward your ears, then let them relax back down. 3. When your shoulders feel loose, rotate your shoulders in backward and forward circles. Do shoulder rolls slowly for one minute as tolerated. Exercise B: March in place 1. As if you are marching, pump your  arms and lift your legs up and down. Lift your knees as high as you can. ? If you are unable to lift your knees, just pump your arms and move your ankles and feet up and down. March in place for one minute as tolerated. Exercise C: Seated jumping jacks 1. Let your arms hang down straight. 2. Keeping your arms straight, lift them up over your head. Aim to point your fingers to the ceiling. 3. While you lift your arms, straighten your legs and slide your heels along the floor to your sides, as wide as you can. 4. As you bring your  arms back down to your sides, slide your legs back together. ? If you are unable to use your legs, just move your arms. Slowly repeat seated jumping jacks for one minute as tolerated. Strengthening exercises Exercise A: Shoulder squeeze 1. Hold your arms straight out from your body to your sides, with your elbows bent and your fists pointed at the ceiling. 2. Keeping your arms in the bent position, move them forward so your elbows and forearms meet in front of your face. 3. Open your arms back out as wide as you can with your elbows still bent, until you feel your shoulder blades squeezing together. Hold for 5 seconds. Slowly repeat the movements forward and backward for one minute as tolerated. Contact a health care provider if you:  Had to stop exercising due to any of the following: ? Pain. ? Nausea. ? Shortness of breath. ? Dizziness. ? Fatigue.  Have significant pain or soreness after exercising. Get help right away if you have:  Chest pain.  Difficulty breathing. These symptoms may represent a serious problem that is an emergency. Do not wait to see if the symptoms will go away. Get medical help right away. Call your local emergency services (911 in the [place] Do not drive yourself to the hospital. This information is not intended to replace advice given to you by your health care provider. Make sure you discuss any questions you have with your health care provider. Document Revised: 07/31/2018 Document Reviewed: 02/20/2017 Elsevier Patient Education  [employer].    If you have lab work done today you will be contacted with your lab results within the next 2 weeks.  If you have not heard from [place] then please contact us. The fastest way to get your results is to register for My Chart.   IF you received an x-ray today, you will receive an invoice from [facility]. Please contact [facility] at [phone] with questions or concerns regarding your  invoice.   IF you received labwork today, you will receive an invoice from [place]. Please contact [facility] at [phone] with questions or concerns regarding your invoice.   Our billing staff will not be able to assist you with questions regarding bills from these companies.  You will be contacted with the lab results as soon as they are available. The fastest way to get your results is to activate your My Chart account. Instructions are located on the last page of this paperwork. If you have not heard from [place] regarding the results in 2 weeks, please contact this office.

## 2020-01-15 ENCOUNTER — Other Ambulatory Visit: Payer: Self-pay | Admitting: Family Medicine

## 2020-01-15 LAB — CMP14+EGFR
ALT: 21 IU/L (ref 0–32)
AST: 20 IU/L (ref 0–40)
Albumin/Globulin Ratio: 1.4 (ref 1.2–2.2)
Albumin: 3.8 g/dL (ref 3.5–4.6)
Alkaline Phosphatase: 104 IU/L (ref 44–121)
BUN/Creatinine Ratio: 24 (ref 12–28)
BUN: 17 mg/dL (ref 10–36)
Bilirubin Total: 0.5 mg/dL (ref 0.0–1.2)
CO2: 21 mmol/L (ref 20–29)
Calcium: 9.6 mg/dL (ref 8.7–10.3)
Chloride: 105 mmol/L (ref 96–106)
Creatinine, Ser: 0.71 mg/dL (ref 0.57–1.00)
GFR calc Af Amer: 85 mL/min/{1.73_m2} (ref >59)
GFR calc non Af Amer: 74 mL/min/{1.73_m2} (ref >59)
Globulin, Total: 2.8 g/dL (ref 1.5–4.5)
Glucose: 98 mg/dL (ref 65–99)
Potassium: 4.4 mmol/L (ref 3.5–5.2)
Sodium: 142 mmol/L (ref 134–144)
Total Protein: 6.6 g/dL (ref 6.0–8.5)

## 2020-01-15 NOTE — Telephone Encounter (Signed)
  Notes to clinic This pt was seen 9/23 by Dr Pamella Pert but has no PCP listed. I can not approve it without a PCP.

## 2020-01-19 ENCOUNTER — Other Ambulatory Visit: Payer: Self-pay | Admitting: Family Medicine

## 2020-02-10 ENCOUNTER — Ambulatory Visit: Payer: Self-pay

## 2020-02-10 NOTE — Telephone Encounter (Signed)
Pt.'s son reports yesterday pt. Started complaining of lightheadedness. Also has some confusion to "dates and times." No other symptoms.Warm transfer to the practice.  Answer Assessment - Initial Assessment Questions 1. DESCRIPTION: "Describe your dizziness."     Lightheadedness 2. LIGHTHEADED: "Do you feel lightheaded?" (e.g., somewhat faint, woozy, weak upon standing)     Woozy 3. VERTIGO: "Do you feel like either you or the room is spinning or tilting?" (i.e. vertigo)     No 4. SEVERITY: "How bad is it?"  "Do you feel like you are going to faint?" "Can you stand and walk?"   - MILD: Feels slightly dizzy, but walking normally.   - MODERATE: Feels very unsteady when walking, but not falling; interferes with normal activities (e.g., school, work) .   - SEVERE: Unable to walk without falling, or requires assistance to walk without falling; feels like passing out now.      Mild 5. ONSET:  "When did the dizziness begin?"     Yesterday 6. AGGRAVATING FACTORS: "Does anything make it worse?" (e.g., standing, change in head position)     Movement 7. HEART RATE: "Can you tell me your heart rate?" "How many beats in 15 seconds?"  (Note: not all patients can do this)       No 8. CAUSE: "What do you think is causing the dizziness?"     Unsure 9. RECURRENT SYMPTOM: "Have you had dizziness before?" If Yes, ask: "When was the last time?" "What happened that time?"     No 10. OTHER SYMPTOMS: "Do you have any other symptoms?" (e.g., fever, chest pain, vomiting, diarrhea, bleeding)       No 11. PREGNANCY: "Is there any chance you are pregnant?" "When was your last menstrual period?"       No  Protocols used: DIZZINESS [facility]

## 2020-02-11 ENCOUNTER — Encounter: Payer: Self-pay | Admitting: Emergency Medicine

## 2020-02-11 ENCOUNTER — Other Ambulatory Visit: Payer: Self-pay

## 2020-02-11 ENCOUNTER — Ambulatory Visit (INDEPENDENT_AMBULATORY_CARE_PROVIDER_SITE_OTHER): Payer: Medicare Other | Admitting: Emergency Medicine

## 2020-02-11 VITALS — BP 109/64 | HR 104 | Temp 98.2°F | Resp 16 | Ht 62.0 in | Wt 184.0 lb

## 2020-02-11 DIAGNOSIS — F0390 Unspecified dementia without behavioral disturbance: Secondary | ICD-10-CM | POA: Diagnosis not present

## 2020-02-11 DIAGNOSIS — E1169 Type 2 diabetes mellitus with other specified complication: Secondary | ICD-10-CM | POA: Diagnosis not present

## 2020-02-11 DIAGNOSIS — I10 Essential (primary) hypertension: Secondary | ICD-10-CM

## 2020-02-11 DIAGNOSIS — R2990 Unspecified symptoms and signs involving the nervous system: Secondary | ICD-10-CM

## 2020-02-11 DIAGNOSIS — E785 Hyperlipidemia, unspecified: Secondary | ICD-10-CM

## 2020-02-11 DIAGNOSIS — E1159 Type 2 diabetes mellitus with other circulatory complications: Secondary | ICD-10-CM | POA: Diagnosis not present

## 2020-02-11 DIAGNOSIS — I152 Hypertension secondary to endocrine disorders: Secondary | ICD-10-CM

## 2020-02-11 NOTE — Patient Instructions (Addendum)
   If you have lab work done today you will be contacted with your lab results within the next 2 weeks.  If you have not heard from us then please contact us. The fastest way to get your results is to register for My Chart.   IF you received an x-ray today, you will receive an invoice from [facility]. Please contact [facility] at [phone] with questions or concerns regarding your invoice.   IF you received labwork today, you will receive an invoice from [facility]. Please contact [facility] at [phone] with questions or concerns regarding your invoice.   Our billing staff will not be able to assist you with questions regarding bills from these companies.  You will be contacted with the lab results as soon as they are available. The fastest way to get your results is to activate your My Chart account. Instructions are located on the last page of this paperwork. If you have not heard from us regarding the results in 2 weeks, please contact this office.     Health Maintenance After Age 65 After age 84, you are at a higher risk for certain long-term diseases and infections as well as injuries from falls. Falls are a major cause of broken bones and head injuries in people who are older than age 84. Getting regular preventive care can help to keep you healthy and well. Preventive care includes getting regular testing and making lifestyle changes as recommended by your health care provider. Talk with your health care provider about:  Which screenings and tests you should have. A screening is a test that checks for a disease when you have no symptoms.  A diet and exercise plan that is right for you. What should I know about screenings and tests to prevent falls? Screening and testing are the best ways to find a health problem early. Early diagnosis and treatment give you the best chance of managing medical conditions that are common after age 84. Certain conditions and  lifestyle choices may make you more likely to have a fall. Your health care provider may recommend:  Regular vision checks. Poor vision and conditions such as cataracts can make you more likely to have a fall. If you wear glasses, make sure to get your prescription updated if your vision changes.  Medicine review. Work with your health care provider to regularly review all of the medicines you are taking, including over-the-counter medicines. Ask your health care provider about any side effects that may make you more likely to have a fall. Tell your health care provider if any medicines that you take make you feel dizzy or sleepy.  Osteoporosis screening. Osteoporosis is a condition that causes the bones to get weaker. This can make the bones weak and cause them to break more easily.  Blood pressure screening. Blood pressure changes and medicines to control blood pressure can make you feel dizzy.  Strength and balance checks. Your health care provider may recommend certain tests to check your strength and balance while standing, walking, or changing positions.  Foot health exam. Foot pain and numbness, as well as not wearing proper footwear, can make you more likely to have a fall.  Depression screening. You may be more likely to have a fall if you have a fear of falling, feel emotionally low, or feel unable to do activities that you used to do.  Alcohol use screening. Using too much alcohol can affect your balance and may make you more likely to   have a fall. What actions can I take to lower my risk of falls? General instructions  Talk with your health care provider about your risks for falling. Tell your health care provider if: ? You fall. Be sure to tell your health care provider about all falls, even ones that seem minor. ? You feel dizzy, sleepy, or off-balance.  Take over-the-counter and prescription medicines only as told by your health care provider. These include any  supplements.  Eat a healthy diet and maintain a healthy weight. A healthy diet includes low-fat dairy products, low-fat (lean) meats, and fiber from whole grains, beans, and lots of fruits and vegetables. Home safety  Remove any tripping hazards, such as rugs, cords, and clutter.  Install safety equipment such as grab bars in bathrooms and safety rails on stairs.  Keep rooms and walkways well-lit. Activity   Follow a regular exercise program to stay fit. This will help you maintain your balance. Ask your health care provider what types of exercise are appropriate for you.  If you need a cane or walker, use it as recommended by your health care provider.  Wear supportive shoes that have nonskid soles. Lifestyle  Do not drink alcohol if your health care provider tells you not to drink.  If you drink alcohol, limit how much you have: ? 0-1 drink a day for women. ? 0-2 drinks a day for men.  Be aware of how much alcohol is in your drink. In the [place] one drink equals one typical bottle of beer (12 oz), one-half glass of wine (5 oz), or one shot of hard liquor (1 oz).  Do not use any products that contain nicotine or tobacco, such as cigarettes and e-cigarettes. If you need help quitting, ask your health care provider. Summary  Having a healthy lifestyle and getting preventive care can help to protect your health and wellness after age 84.  Screening and testing are the best way to find a health problem early and help you avoid having a fall. Early diagnosis and treatment give you the best chance for managing medical conditions that are more common for people who are older than age 84.  Falls are a major cause of broken bones and head injuries in people who are older than age 84. Take precautions to prevent a fall at home.  Work with your health care provider to learn what changes you can make to improve your health and wellness and to prevent falls. This information is not intended  to replace advice given to you by your health care provider. Make sure you discuss any questions you have with your health care provider. Document Revised: 07/31/2018 Document Reviewed: 02/20/2017 Elsevier Patient Education  2020 [employer]  

## 2020-02-11 NOTE — Progress Notes (Signed)
Anne Rice 86 y.o.   Chief Complaint  Patient presents with  . Altered Mental Status    per patient's son this happened yesterday and today normal    HISTORY OF PRESENT ILLNESS: This is a 86 y.o. female complaining of episode of altered mental status yesterday that lasted about 2 hours as per son who is here with her today.  Patient used to see Dr. Pamella Rice.  First visit with me. Most recent office visit notes reviewed.  Past medical history and problem list reviewed. Per son patient became confused and disoriented yesterday.  Back to normal today.  Presently asymptomatic.  Has no complaints. No flulike symptoms.  No fever or chills.  Patient denies urinary symptoms.  Eating and drinking well.  No nausea or vomiting. No other significant symptoms.  HPI   Prior to Admission medications   Medication Sig Start Date End Date Taking? Authorizing Provider  amLODipine (NORVASC) 2.5 MG tablet TAKE 1 TABLET BY MOUTH EVERY DAY 01/18/20  Yes Rutherford Guys, MD  aspirin EC 81 MG tablet Take 81 mg by mouth daily.    Yes [provider]  diclofenac (VOLTAREN) 50 MG EC tablet TAKE 1 TABLET BY MOUTH EVERYDAY AS NEEDED FOR MODERATE PAIN. 11/27/19  Yes Rutherford Guys, MD    Not on File  Patient Active Problem List   Diagnosis Date Noted  . Murmur 12/06/2015  . Type 2 diabetes mellitus ([place]) 01/11/2014  . Hyperlipidemia LDL goal <100 01/11/2014  . Obesity, unspecified 01/11/2014  . Basal cell carcinoma of skin 09/04/2013  . HTN (hypertension) 05/18/2013  . Dementia ([place]) 05/18/2013    Past Medical History:  Diagnosis Date  . Depression   . Murmur 12/06/2015    Past Surgical History:  Procedure Laterality Date  . ABDOMINAL HYSTERECTOMY    . APPENDECTOMY      Social History   Socioeconomic History  . Marital status: Unknown    Spouse name: Not on file  . Number of children: Not on file  . Years of education: Not on file  . Highest education level: Not on file    Occupational History  . Not on file  Tobacco Use  . Smoking status: Former Smoker    Quit date: 04/24/1967    Years since quitting: 52.8  . Smokeless tobacco: Never Used  Substance and Sexual Activity  . Alcohol use: No  . Drug use: No  . Sexual activity: Never  Other Topics Concern  . Not on file  Social History Narrative  . Not on file   Social Determinants of Health   Financial Resource Strain:   . Difficulty of Paying Living Expenses: Not on file  Food Insecurity:   . Worried About Charity fundraiser in the Last Year: Not on file  . Ran Out of Food in the Last Year: Not on file  Transportation Needs:   . Lack of Transportation (Medical): Not on file  . Lack of Transportation (Non-Medical): Not on file  Physical Activity:   . Days of Exercise per Week: Not on file  . Minutes of Exercise per Session: Not on file  Stress:   . Feeling of Stress : Not on file  Social Connections:   . Frequency of Communication with Friends and Family: Not on file  . Frequency of Social Gatherings with Friends and Family: Not on file  . Attends Religious Services: Not on file  . Active Member of Clubs or Organizations: Not on file  .  Attends Archivist Meetings: Not on file  . Marital Status: Not on file  Intimate Partner Violence:   . Fear of Current or Ex-Partner: Not on file  . Emotionally Abused: Not on file  . Physically Abused: Not on file  . Sexually Abused: Not on file    Family History  Problem Relation Age of Onset  . Parkinson's disease Father   . Dementia Brother      Review of Systems  Constitutional: Negative.  Negative for chills and fever.  HENT: Negative.  Negative for congestion and sore throat.   Respiratory: Negative.  Negative for cough and shortness of breath.   Cardiovascular: Negative.  Negative for chest pain and palpitations.  Gastrointestinal: Negative.  Negative for abdominal pain, diarrhea, nausea and vomiting.  Genitourinary: Negative.   Negative for dysuria.  Musculoskeletal: Negative.   Skin: Negative.  Negative for rash.  Neurological: Negative.  Negative for dizziness and headaches.    Today's Vitals   02/11/20 1012  BP: 109/64  Pulse: (!) 104  Resp: 16  Temp: 98.2 F (36.8 C)  TempSrc: Temporal  SpO2: 98%  Weight: 184 lb (83.5 kg)  Height: 5\' 2"  (1.575 m)   Body mass index is 33.65 kg/m.  Physical Exam Vitals reviewed.  Constitutional:      Appearance: Normal appearance. She is obese.  HENT:     Head: Normocephalic.  Eyes:     Extraocular Movements: Extraocular movements intact.     Conjunctiva/sclera: Conjunctivae normal.     Pupils: Pupils are equal, round, and reactive to light.  Cardiovascular:     Rate and Rhythm: Normal rate and regular rhythm.     Pulses: Normal pulses.     Heart sounds: Normal heart sounds.  Pulmonary:     Effort: Pulmonary effort is normal.     Breath sounds: Normal breath sounds.  Abdominal:     Palpations: Abdomen is soft.     Tenderness: There is no abdominal tenderness.  Musculoskeletal:     Cervical back: Normal range of motion. No tenderness.  Lymphadenopathy:     Cervical: No cervical adenopathy.  Skin:    General: Skin is warm and dry.  Neurological:     General: No focal deficit present.     Mental Status: She is alert and oriented to person, place, and time.     Sensory: No sensory deficit.     Motor: No weakness.  Psychiatric:        Mood and Affect: Mood normal.        Behavior: Behavior normal.      ASSESSMENT & PLAN: Clinically stable.  No red flag signs or symptoms.  No signs of a stroke. Advised to monitor symptoms and contact the office if no better or worse in the next several days.  ED precautions given. [name] was seen today for altered mental status.  Diagnoses and all orders for this visit:  Episode of transient neurologic symptoms  Hyperlipidemia associated with type 2 diabetes mellitus ([place])  Dementia without behavioral  disturbance, unspecified dementia type ([place])  Essential hypertension  Hypertension associated with diabetes [facility])    Patient Instructions       If you have lab work done today you will be contacted with your lab results within the next 2 weeks.  If you have not heard from [place] then please contact us. The fastest way to get your results is to register for My Chart.   IF you received an x-ray today,  you will receive an invoice from [facility]. Please contact [facility] at [phone] with questions or concerns regarding your invoice.   IF you received labwork today, you will receive an invoice from [place]. Please contact [facility] at [phone] with questions or concerns regarding your invoice.   Our billing staff will not be able to assist you with questions regarding bills from these companies.  You will be contacted with the lab results as soon as they are available. The fastest way to get your results is to activate your My Chart account. Instructions are located on the last page of this paperwork. If you have not heard from [place] regarding the results in 2 weeks, please contact this office.     Health Maintenance After Age 19 After age 67, you are at a higher risk for certain long-term diseases and infections as well as injuries from falls. Falls are a major cause of broken bones and head injuries in people who are older than age 41. Getting regular preventive care can help to keep you healthy and well. Preventive care includes getting regular testing and making lifestyle changes as recommended by your health care provider. Talk with your health care provider about:  Which screenings and tests you should have. A screening is a test that checks for a disease when you have no symptoms.  A diet and exercise plan that is right for you. What should I know about screenings and tests to prevent falls? Screening and testing are the best ways to find a health problem  early. Early diagnosis and treatment give you the best chance of managing medical conditions that are common after age 108. Certain conditions and lifestyle choices may make you more likely to have a fall. Your health care provider may recommend:  Regular vision checks. Poor vision and conditions such as cataracts can make you more likely to have a fall. If you wear glasses, make sure to get your prescription updated if your vision changes.  Medicine review. Work with your health care provider to regularly review all of the medicines you are taking, including over-the-counter medicines. Ask your health care provider about any side effects that may make you more likely to have a fall. Tell your health care provider if any medicines that you take make you feel dizzy or sleepy.  Osteoporosis screening. Osteoporosis is a condition that causes the bones to get weaker. This can make the bones weak and cause them to break more easily.  Blood pressure screening. Blood pressure changes and medicines to control blood pressure can make you feel dizzy.  Strength and balance checks. Your health care provider may recommend certain tests to check your strength and balance while standing, walking, or changing positions.  Foot health exam. Foot pain and numbness, as well as not wearing proper footwear, can make you more likely to have a fall.  Depression screening. You may be more likely to have a fall if you have a fear of falling, feel emotionally low, or feel unable to do activities that you used to do.  Alcohol use screening. Using too much alcohol can affect your balance and may make you more likely to have a fall. What actions can I take to lower my risk of falls? General instructions  Talk with your health care provider about your risks for falling. Tell your health care provider if: ? You fall. Be sure to tell your health care provider about all falls, even ones that seem minor. ? You feel  dizzy, sleepy,  or off-balance.  Take over-the-counter and prescription medicines only as told by your health care provider. These include any supplements.  Eat a healthy diet and maintain a healthy weight. A healthy diet includes low-fat dairy products, low-fat (lean) meats, and fiber from whole grains, beans, and lots of fruits and vegetables. Home safety  Remove any tripping hazards, such as rugs, cords, and clutter.  Install safety equipment such as grab bars in bathrooms and safety rails on stairs.  Keep rooms and walkways well-lit. Activity   Follow a regular exercise program to stay fit. This will help you maintain your balance. Ask your health care provider what types of exercise are appropriate for you.  If you need a cane or walker, use it as recommended by your health care provider.  Wear supportive shoes that have nonskid soles. Lifestyle  Do not drink alcohol if your health care provider tells you not to drink.  If you drink alcohol, limit how much you have: ? 0-1 drink a day for women. ? 0-2 drinks a day for men.  Be aware of how much alcohol is in your drink. In the [place] one drink equals one typical bottle of beer (12 oz), one-half glass of wine (5 oz), or one shot of hard liquor (1 oz).  Do not use any products that contain nicotine or tobacco, such as cigarettes and e-cigarettes. If you need help quitting, ask your health care provider. Summary  Having a healthy lifestyle and getting preventive care can help to protect your health and wellness after age 110.  Screening and testing are the best way to find a health problem early and help you avoid having a fall. Early diagnosis and treatment give you the best chance for managing medical conditions that are more common for people who are older than age 50.  Falls are a major cause of broken bones and head injuries in people who are older than age 65. Take precautions to prevent a fall at home.  Work with your health care  provider to learn what changes you can make to improve your health and wellness and to prevent falls. This information is not intended to replace advice given to you by your health care provider. Make sure you discuss any questions you have with your health care provider. Document Revised: 07/31/2018 Document Reviewed: 02/20/2017 Elsevier Patient Education  2020 Elsevier Inc.      Agustina Caroli, MD [facility]

## 2020-04-08 ENCOUNTER — Encounter: Payer: Self-pay | Admitting: Family Medicine

## 2020-04-08 ENCOUNTER — Ambulatory Visit (INDEPENDENT_AMBULATORY_CARE_PROVIDER_SITE_OTHER): Payer: Medicare Other

## 2020-04-08 ENCOUNTER — Other Ambulatory Visit: Payer: Self-pay

## 2020-04-08 ENCOUNTER — Ambulatory Visit (INDEPENDENT_AMBULATORY_CARE_PROVIDER_SITE_OTHER): Payer: Medicare Other | Admitting: Family Medicine

## 2020-04-08 VITALS — BP 116/68 | HR 89 | Temp 97.6°F | Ht 60.0 in | Wt 169.8 lb

## 2020-04-08 DIAGNOSIS — N62 Hypertrophy of breast: Secondary | ICD-10-CM

## 2020-04-08 DIAGNOSIS — M545 Low back pain, unspecified: Secondary | ICD-10-CM

## 2020-04-08 DIAGNOSIS — R634 Abnormal weight loss: Secondary | ICD-10-CM

## 2020-04-08 DIAGNOSIS — R630 Anorexia: Secondary | ICD-10-CM

## 2020-04-08 DIAGNOSIS — Z9181 History of falling: Secondary | ICD-10-CM | POA: Diagnosis not present

## 2020-04-08 NOTE — Progress Notes (Signed)
Patient ID: Anne Rice, female    DOB: December 11, 1924  Age: 86 y.o. MRN: [id number]  Chief Complaint  Patient presents with  . Back Pain    Mentioned 1 week to 10 days   . Anorexia    Tapering down since sept.    Subjective:   86 year old lady who is brought in by her son and grandson.  Patient has been complaining more of her back hurting her of late.  It is not constant but intermittent.  She had a little TIA about 3 months ago.  She has had a couple of little falls but no major fall that they know of.  She lives with her son.  The grand children live out of town and coming to visit for the holidays.  They're concerned about her.  She has not been eating as well of late and has been losing some weight.  Current allergies, medications, problem list, past/family and social histories reviewed.  Objective:  BP 116/68   Pulse 89   Temp 97.6 F (36.4 C) (Temporal)   Ht 5' (1.524 m)   Wt 169 lb 12.8 oz (77 kg)   SpO2 96%   BMI 33.16 kg/m   No acute distress.  A little difficult to communicate with.  Hard of hearing and understanding.  Comprehends what her son says better than what I say.  Chest clear.  Heart regular without murmur.  Tender in the upper and mid lumbar spine region.  Mild curvature of her upper back.  She also sex forward.  She has macromastia.  Assessment & Plan:   Assessment: 1. Midline low back pain without sciatica, unspecified chronicity   2. Personal history of fall   3. Loss of weight   4. Poor appetite   5. Macromastia       Plan: I looked at the x-rays.  Reading is still pending.  She does not have any obvious compression fracture that I can see.  No major things to offer.  See instructions.  Talk to them about seeing what community services might be available to help signing caring for her.  Radiology readings just came back.  Advanced multilevel lumbar spondylosis.  There is nothing to do definitively for that.  Orders Placed This Encounter   Procedures  . DG Lumbar Spine 2-3 Views    Standing Status:   Future    Number of Occurrences:   1    Standing Expiration Date:   04/08/2021    Order Specific Question:   Reason for Exam (SYMPTOM  OR DIAGNOSIS REQUIRED)    Answer:   low back pain.  History of possible falls.    Order Specific Question:   Preferred imaging location?    Answer:   External  . CBC  . CMP14+EGFR  . TSH    No orders of the defined types were placed in this encounter.        Patient Instructions   [email or web address]  Take Tylenol 500 mg 2 pills 3 times daily (acetaminophen).  Do not exceed 3000 mg in 24 hours.  Try and do a fall risk assessment of your home.  There are lists online. [email or web address]  Continue to encourage eating.  Return as needed.   If you have lab work done today you will be contacted with your lab results within the next 2 weeks.  If you have not heard from [place] then please contact us. The fastest way to get your results is to register for  My Chart.   IF you received an x-ray today, you will receive an invoice from [facility]. Please contact [facility] at [phone] with questions or concerns regarding your invoice.   IF you received labwork today, you will receive an invoice from [place]. Please contact [facility] at [phone] with questions or concerns regarding your invoice.   Our billing staff will not be able to assist you with questions regarding bills from these companies.  You will be contacted with the lab results as soon as they are available. The fastest way to get your results is to activate your My Chart account. Instructions are located on the last page of this paperwork. If you have not heard from [place] regarding the results in 2 weeks, please contact this office.         Return if symptoms worsen or  fail to improve.   Ruben Reason, MD 04/08/2020

## 2020-04-08 NOTE — Patient Instructions (Addendum)
[email or web address]  Take Tylenol 500 mg 2 pills 3 times daily (acetaminophen).  Do not exceed 3000 mg in 24 hours.  Try and do a fall risk assessment of your home.  There are lists online. [email or web address]  Continue to encourage eating.  Return as needed.   If you have lab work done today you will be contacted with your lab results within the next 2 weeks.  If you have not heard from [place] then please contact us. The fastest way to get your results is to register for My Chart.   IF you received an x-ray today, you will receive an invoice from [facility]. Please contact [facility] at [phone] with questions or concerns regarding your invoice.   IF you received labwork today, you will receive an invoice from [place]. Please contact [facility] at [phone] with questions or concerns regarding your invoice.   Our billing staff will not be able to assist you with questions regarding bills from these companies.  You will be contacted with the lab results as soon as they are available. The fastest way to get your results is to activate your My Chart account. Instructions are located on the last page of this paperwork. If you have not heard from [place] regarding the results in 2 weeks, please contact this office.

## 2020-04-09 ENCOUNTER — Other Ambulatory Visit: Payer: Self-pay | Admitting: Family Medicine

## 2020-04-09 DIAGNOSIS — M5489 Other dorsalgia: Secondary | ICD-10-CM

## 2020-04-09 DIAGNOSIS — D72829 Elevated white blood cell count, unspecified: Secondary | ICD-10-CM

## 2020-04-09 LAB — CBC
Hematocrit: 37.3 % (ref 34.0–46.6)
Hemoglobin: 12 g/dL (ref 11.1–15.9)
MCH: 28.5 pg (ref 26.6–33.0)
MCHC: 32.2 g/dL (ref 31.5–35.7)
MCV: 89 fL (ref 79–97)
Platelets: 561 10*3/uL — ABNORMAL HIGH (ref 150–450)
RBC: 4.21 x10E6/uL (ref 3.77–5.28)
RDW: 12.6 % (ref 11.7–15.4)
WBC: 14.9 10*3/uL — ABNORMAL HIGH (ref 3.4–10.8)

## 2020-04-09 LAB — CMP14+EGFR
ALT: 18 IU/L (ref 0–32)
AST: 20 IU/L (ref 0–40)
Albumin/Globulin Ratio: 1 — ABNORMAL LOW (ref 1.2–2.2)
Albumin: 3.6 g/dL (ref 3.5–4.6)
Alkaline Phosphatase: 142 IU/L — ABNORMAL HIGH (ref 44–121)
BUN/Creatinine Ratio: 26 (ref 12–28)
BUN: 16 mg/dL (ref 10–36)
Bilirubin Total: <0.2 mg/dL (ref 0.0–1.2)
CO2: 25 mmol/L (ref 20–29)
Calcium: 11.3 mg/dL — ABNORMAL HIGH (ref 8.7–10.3)
Chloride: 98 mmol/L (ref 96–106)
Creatinine, Ser: 0.62 mg/dL (ref 0.57–1.00)
GFR calc Af Amer: 90 mL/min/{1.73_m2} (ref >59)
GFR calc non Af Amer: 78 mL/min/{1.73_m2} (ref >59)
Globulin, Total: 3.6 g/dL (ref 1.5–4.5)
Glucose: 120 mg/dL — ABNORMAL HIGH (ref 65–99)
Potassium: 4.9 mmol/L (ref 3.5–5.2)
Sodium: 136 mmol/L (ref 134–144)
Total Protein: 7.2 g/dL (ref 6.0–8.5)

## 2020-04-09 LAB — TSH: TSH: 1.9 u[IU]/mL (ref 0.450–4.500)

## 2020-04-18 ENCOUNTER — Emergency Department (HOSPITAL_COMMUNITY): Payer: Medicare Other

## 2020-04-18 ENCOUNTER — Inpatient Hospital Stay (HOSPITAL_COMMUNITY)
Admission: EM | Admit: 2020-04-18 | Discharge: 2020-04-21 | DRG: 381 | Disposition: A | Discharge status: Home Health | Payer: Medicare Other | Attending: Family Medicine | Admitting: Family Medicine

## 2020-04-18 ENCOUNTER — Other Ambulatory Visit: Payer: Self-pay

## 2020-04-18 ENCOUNTER — Inpatient Hospital Stay (HOSPITAL_COMMUNITY): Payer: Medicare Other

## 2020-04-18 ENCOUNTER — Ambulatory Visit (INDEPENDENT_AMBULATORY_CARE_PROVIDER_SITE_OTHER): Payer: Medicare Other | Admitting: Emergency Medicine

## 2020-04-18 ENCOUNTER — Encounter (HOSPITAL_COMMUNITY): Payer: Self-pay

## 2020-04-18 VITALS — BP 112/64 | HR 75 | Temp 98.2°F | Resp 16 | Ht 60.0 in | Wt 169.0 lb

## 2020-04-18 DIAGNOSIS — I5032 Chronic diastolic (congestive) heart failure: Secondary | ICD-10-CM | POA: Diagnosis present

## 2020-04-18 DIAGNOSIS — F32A Depression, unspecified: Secondary | ICD-10-CM | POA: Diagnosis present

## 2020-04-18 DIAGNOSIS — E860 Dehydration: Secondary | ICD-10-CM | POA: Diagnosis not present

## 2020-04-18 DIAGNOSIS — Z820 Family history of epilepsy and other diseases of the nervous system: Secondary | ICD-10-CM | POA: Diagnosis not present

## 2020-04-18 DIAGNOSIS — I509 Heart failure, unspecified: Secondary | ICD-10-CM | POA: Diagnosis not present

## 2020-04-18 DIAGNOSIS — I350 Nonrheumatic aortic (valve) stenosis: Secondary | ICD-10-CM | POA: Diagnosis not present

## 2020-04-18 DIAGNOSIS — J8410 Pulmonary fibrosis, unspecified: Secondary | ICD-10-CM | POA: Diagnosis not present

## 2020-04-18 DIAGNOSIS — M5459 Other low back pain: Secondary | ICD-10-CM

## 2020-04-18 DIAGNOSIS — F0390 Unspecified dementia without behavioral disturbance: Secondary | ICD-10-CM

## 2020-04-18 DIAGNOSIS — R531 Weakness: Secondary | ICD-10-CM | POA: Diagnosis not present

## 2020-04-18 DIAGNOSIS — Z87891 Personal history of nicotine dependence: Secondary | ICD-10-CM | POA: Diagnosis not present

## 2020-04-18 DIAGNOSIS — I4510 Unspecified right bundle-branch block: Secondary | ICD-10-CM | POA: Diagnosis not present

## 2020-04-18 DIAGNOSIS — D72829 Elevated white blood cell count, unspecified: Secondary | ICD-10-CM | POA: Diagnosis present

## 2020-04-18 DIAGNOSIS — E119 Type 2 diabetes mellitus without complications: Secondary | ICD-10-CM | POA: Diagnosis present

## 2020-04-18 DIAGNOSIS — J9811 Atelectasis: Secondary | ICD-10-CM | POA: Diagnosis not present

## 2020-04-18 DIAGNOSIS — E041 Nontoxic single thyroid nodule: Secondary | ICD-10-CM | POA: Diagnosis not present

## 2020-04-18 DIAGNOSIS — Z8673 Personal history of transient ischemic attack (TIA), and cerebral infarction without residual deficits: Secondary | ICD-10-CM

## 2020-04-18 DIAGNOSIS — I110 Hypertensive heart disease with heart failure: Secondary | ICD-10-CM | POA: Diagnosis present

## 2020-04-18 DIAGNOSIS — M545 Low back pain, unspecified: Secondary | ICD-10-CM | POA: Diagnosis not present

## 2020-04-18 DIAGNOSIS — R109 Unspecified abdominal pain: Secondary | ICD-10-CM | POA: Diagnosis not present

## 2020-04-18 DIAGNOSIS — Z20822 Contact with and (suspected) exposure to covid-19: Secondary | ICD-10-CM | POA: Diagnosis present

## 2020-04-18 DIAGNOSIS — E785 Hyperlipidemia, unspecified: Secondary | ICD-10-CM

## 2020-04-18 DIAGNOSIS — R634 Abnormal weight loss: Secondary | ICD-10-CM | POA: Diagnosis present

## 2020-04-18 DIAGNOSIS — M549 Dorsalgia, unspecified: Secondary | ICD-10-CM | POA: Diagnosis present

## 2020-04-18 DIAGNOSIS — Z66 Do not resuscitate: Secondary | ICD-10-CM | POA: Diagnosis not present

## 2020-04-18 DIAGNOSIS — K2270 Barrett's esophagus without dysplasia: Principal | ICD-10-CM | POA: Diagnosis present

## 2020-04-18 DIAGNOSIS — I10 Essential (primary) hypertension: Secondary | ICD-10-CM | POA: Diagnosis not present

## 2020-04-18 DIAGNOSIS — R3989 Other symptoms and signs involving the genitourinary system: Secondary | ICD-10-CM

## 2020-04-18 DIAGNOSIS — F09 Unspecified mental disorder due to known physiological condition: Secondary | ICD-10-CM | POA: Diagnosis present

## 2020-04-18 DIAGNOSIS — R933 Abnormal findings on diagnostic imaging of other parts of digestive tract: Secondary | ICD-10-CM | POA: Diagnosis not present

## 2020-04-18 DIAGNOSIS — Z6834 Body mass index (BMI) 34.0-34.9, adult: Secondary | ICD-10-CM

## 2020-04-18 DIAGNOSIS — Z79899 Other long term (current) drug therapy: Secondary | ICD-10-CM

## 2020-04-18 DIAGNOSIS — E1159 Type 2 diabetes mellitus with other circulatory complications: Secondary | ICD-10-CM

## 2020-04-18 DIAGNOSIS — Z7982 Long term (current) use of aspirin: Secondary | ICD-10-CM | POA: Diagnosis not present

## 2020-04-18 DIAGNOSIS — R401 Stupor: Secondary | ICD-10-CM | POA: Diagnosis not present

## 2020-04-18 DIAGNOSIS — K449 Diaphragmatic hernia without obstruction or gangrene: Secondary | ICD-10-CM | POA: Diagnosis not present

## 2020-04-18 DIAGNOSIS — M5489 Other dorsalgia: Secondary | ICD-10-CM

## 2020-04-18 DIAGNOSIS — E1169 Type 2 diabetes mellitus with other specified complication: Secondary | ICD-10-CM

## 2020-04-18 DIAGNOSIS — I4891 Unspecified atrial fibrillation: Secondary | ICD-10-CM | POA: Diagnosis not present

## 2020-04-18 DIAGNOSIS — I480 Paroxysmal atrial fibrillation: Secondary | ICD-10-CM | POA: Diagnosis not present

## 2020-04-18 DIAGNOSIS — K2210 Ulcer of esophagus without bleeding: Secondary | ICD-10-CM | POA: Diagnosis not present

## 2020-04-18 DIAGNOSIS — R059 Cough, unspecified: Secondary | ICD-10-CM

## 2020-04-18 DIAGNOSIS — I361 Nonrheumatic tricuspid (valve) insufficiency: Secondary | ICD-10-CM | POA: Diagnosis not present

## 2020-04-18 DIAGNOSIS — R1013 Epigastric pain: Secondary | ICD-10-CM | POA: Diagnosis not present

## 2020-04-18 DIAGNOSIS — I152 Hypertension secondary to endocrine disorders: Secondary | ICD-10-CM

## 2020-04-18 DIAGNOSIS — K209 Esophagitis, unspecified without bleeding: Secondary | ICD-10-CM | POA: Diagnosis not present

## 2020-04-18 DIAGNOSIS — E079 Disorder of thyroid, unspecified: Secondary | ICD-10-CM

## 2020-04-18 LAB — POC MICROSCOPIC URINALYSIS (UMFC): Mucus: ABSENT

## 2020-04-18 LAB — URINALYSIS, ROUTINE W REFLEX MICROSCOPIC
Bacteria, UA: NONE SEEN
Bilirubin Urine: NEGATIVE
Glucose, UA: NEGATIVE mg/dL
Hgb urine dipstick: NEGATIVE
Ketones, ur: NEGATIVE mg/dL
Nitrite: NEGATIVE
Protein, ur: NEGATIVE mg/dL
Specific Gravity, Urine: 1.044 — ABNORMAL HIGH (ref 1.005–1.030)
pH: 6 (ref 5.0–8.0)

## 2020-04-18 LAB — COMPREHENSIVE METABOLIC PANEL
ALT: 19 U/L (ref 0–44)
AST: 22 U/L (ref 15–41)
Albumin: 2.9 g/dL — ABNORMAL LOW (ref 3.5–5.0)
Alkaline Phosphatase: 144 U/L — ABNORMAL HIGH (ref 38–126)
Anion gap: 10 (ref 5–15)
BUN: 26 mg/dL — ABNORMAL HIGH (ref 8–23)
CO2: 27 mmol/L (ref 22–32)
Calcium: 11.5 mg/dL — ABNORMAL HIGH (ref 8.9–10.3)
Chloride: 98 mmol/L (ref 98–111)
Creatinine, Ser: 0.72 mg/dL (ref 0.44–1.00)
GFR, Estimated: >60 mL/min (ref >60)
Glucose, Bld: 117 mg/dL — ABNORMAL HIGH (ref 70–99)
Potassium: 4.4 mmol/L (ref 3.5–5.1)
Sodium: 135 mmol/L (ref 135–145)
Total Bilirubin: 0.3 mg/dL (ref 0.3–1.2)
Total Protein: 7.8 g/dL (ref 6.5–8.1)

## 2020-04-18 LAB — POCT CBC
Granulocyte percent: 80.5 %G — AB (ref 37–80)
HCT, POC: 35.7 % (ref 29–41)
Hemoglobin: 11.8 g/dL (ref 11–14.6)
Lymph, poc: 2.3 (ref 0.6–3.4)
MCH, POC: 28.9 pg (ref 27–31.2)
MCHC: 33.2 g/dL (ref 31.8–35.4)
MCV: 87.2 fL (ref 76–111)
MID (cbc): 1.7 — AB (ref 0–0.9)
MPV: 7.7 fL (ref 0–99.8)
POC Granulocyte: 16.3 — AB (ref 2–6.9)
POC LYMPH PERCENT: 11.1 %L (ref 10–50)
POC MID %: 8.4 %M (ref 0–12)
Platelet Count, POC: 610 10*3/uL — AB (ref 142–424)
RBC: 4.09 M/uL (ref 4.04–5.48)
RDW, POC: 14.9 %
WBC: 20.3 10*3/uL — AB (ref 4.6–10.2)

## 2020-04-18 LAB — POCT URINALYSIS DIP (MANUAL ENTRY)
Bilirubin, UA: NEGATIVE
Blood, UA: NEGATIVE
Glucose, UA: NEGATIVE mg/dL
Ketones, POC UA: NEGATIVE mg/dL
Nitrite, UA: NEGATIVE
Protein Ur, POC: NEGATIVE mg/dL
Spec Grav, UA: >=1.03 — AB (ref 1.010–1.025)
Urobilinogen, UA: 0.2 E.U./dL
pH, UA: 5.5 (ref 5.0–8.0)

## 2020-04-18 LAB — HEMOGLOBIN A1C
Hgb A1c MFr Bld: 6.2 % — ABNORMAL HIGH (ref 4.8–5.6)
Mean Plasma Glucose: 131.24 mg/dL

## 2020-04-18 LAB — CBC WITH DIFFERENTIAL/PLATELET
Abs Immature Granulocytes: 0.09 10*3/uL — ABNORMAL HIGH (ref 0.00–0.07)
Basophils Absolute: 0.1 10*3/uL (ref 0.0–0.1)
Basophils Relative: 0 %
Eosinophils Absolute: 0.1 10*3/uL (ref 0.0–0.5)
Eosinophils Relative: 0 %
HCT: 37.3 % (ref 36.0–46.0)
Hemoglobin: 11.9 g/dL — ABNORMAL LOW (ref 12.0–15.0)
Immature Granulocytes: 0 %
Lymphocytes Relative: 11 %
Lymphs Abs: 2.3 10*3/uL (ref 0.7–4.0)
MCH: 28.1 pg (ref 26.0–34.0)
MCHC: 31.9 g/dL (ref 30.0–36.0)
MCV: 88.2 fL (ref 80.0–100.0)
Monocytes Absolute: 1.6 10*3/uL — ABNORMAL HIGH (ref 0.1–1.0)
Monocytes Relative: 8 %
Neutro Abs: 16.8 10*3/uL — ABNORMAL HIGH (ref 1.7–7.7)
Neutrophils Relative %: 81 %
Platelets: 528 10*3/uL — ABNORMAL HIGH (ref 150–400)
RBC: 4.23 MIL/uL (ref 3.87–5.11)
RDW: 13.9 % (ref 11.5–15.5)
WBC: 21 10*3/uL — ABNORMAL HIGH (ref 4.0–10.5)
nRBC: 0 % (ref 0.0–0.2)

## 2020-04-18 LAB — TSH: TSH: 0.931 u[IU]/mL (ref 0.350–4.500)

## 2020-04-18 LAB — MAGNESIUM: Magnesium: 2.1 mg/dL (ref 1.7–2.4)

## 2020-04-18 LAB — CALCIUM: Calcium: 11.7 mg/dL — ABNORMAL HIGH (ref 8.7–10.3)

## 2020-04-18 MED ORDER — ACETAMINOPHEN 325 MG PO TABS
650.0000 mg | ORAL_TABLET | Freq: Four times a day (QID) | ORAL | Status: DC | PRN
Start: 2020-04-18 — End: 2020-04-20
  Administered 2020-04-19: 650 mg via ORAL
  Filled 2020-04-18: qty 2

## 2020-04-18 MED ORDER — PANTOPRAZOLE SODIUM 40 MG IV SOLR
40.0000 mg | Freq: Two times a day (BID) | INTRAVENOUS | Status: DC
  Administered 2020-04-18 – 2020-04-21 (×5): 40 mg via INTRAVENOUS
  Filled 2020-04-18 (×5): qty 40

## 2020-04-18 MED ORDER — SODIUM CHLORIDE 0.9 % IV SOLN: INTRAVENOUS | Status: DC

## 2020-04-18 MED ORDER — ENOXAPARIN SODIUM 40 MG/0.4ML ~~LOC~~ SOLN
40.0000 mg | SUBCUTANEOUS | Status: DC
  Administered 2020-04-19 – 2020-04-21 (×3): 40 mg via SUBCUTANEOUS
  Filled 2020-04-18 (×3): qty 0.4

## 2020-04-18 MED ORDER — ONDANSETRON HCL 4 MG PO TABS: 4.0000 mg | ORAL_TABLET | Freq: Four times a day (QID) | ORAL | Status: DC | PRN

## 2020-04-18 MED ORDER — SODIUM CHLORIDE 0.9 % IV BOLUS
500.0000 mL | Freq: Once | INTRAVENOUS | Status: AC
  Administered 2020-04-18: 15:00:00 500 mL via INTRAVENOUS

## 2020-04-18 MED ORDER — SODIUM CHLORIDE 0.9 % IV BOLUS
500.0000 mL | Freq: Once | INTRAVENOUS | Status: AC
  Administered 2020-04-18: 16:00:00 500 mL via INTRAVENOUS

## 2020-04-18 MED ORDER — ACETAMINOPHEN 650 MG RE SUPP: 650.0000 mg | Freq: Four times a day (QID) | RECTAL | Status: DC | PRN

## 2020-04-18 MED ORDER — IOHEXOL 300 MG/ML  SOLN
100.0000 mL | Freq: Once | INTRAMUSCULAR | Status: AC | PRN
  Administered 2020-04-18: 14:00:00 100 mL via INTRAVENOUS

## 2020-04-18 MED ORDER — ONDANSETRON HCL 4 MG/2ML IJ SOLN: 4.0000 mg | Freq: Four times a day (QID) | INTRAMUSCULAR | Status: DC | PRN

## 2020-04-18 MED ORDER — INSULIN ASPART 100 UNIT/ML ~~LOC~~ SOLN
0.0000 [IU] | Freq: Three times a day (TID) | SUBCUTANEOUS | Status: DC
  Filled 2020-04-18: qty 0.09

## 2020-04-18 MED ORDER — METOPROLOL TARTRATE 5 MG/5ML IV SOLN: 5.0000 mg | Freq: Four times a day (QID) | INTRAVENOUS | Status: DC | PRN

## 2020-04-18 NOTE — ED Notes (Addendum)
Per 6E charge nurse, must wait for COVID result before giving report and transporting patient.

## 2020-04-18 NOTE — H&P (Signed)
History and Physical    Anne Rice [id number] DOB: [birth year] DOA: 04/18/2020  PCP: Patient, No Pcp Per  Patient coming from: Home  Chief Complaint: Abnormal lab, sent in by her primary care physician due to leukocytosis and hypercalcemia  HPI: Anne Rice is a 86 y.o. female with medical history significant of hypertension, diet-controlled diabetes, mild dementia who presents to the hospital from her primary care physician's office due to abnormal labs including leukocytosis and hypercalcemia.  Son who is patient's primary caregiver is at bedside and provides most of the history.  He states that she suffered a TIA at the end of October 2021.  Since that time, patient has slowly declined, appetite has decreased and patient has gotten gradually weaker.  She went to her primary care physician's office with chief complaint of fatigue, bilateral lower extremity edema and weight loss.  Patient has no complaints of fevers, chills, nausea, vomiting, diarrhea or dysuria.  She does admit to some epigastric abdominal pain that comes and goes.  Patient does admit to some substernal chest pain with swallowing solid food.  At baseline, patient son states that patient is able to walk around the house, is typically alert and oriented x3 but is forgetful and needs frequent reminders regarding time.  She does need assistance with bathing and other simple activities of daily living.  ED Course: Labs reveal hypercalcemia calcium 11.5, leukocytosis WBC 21, negative UA.  CT chest abdomen pelvis was completed which revealed moderate sized hiatal hernia as well as irregularity of the distal portion of the esophagus concerning for possible esophagitis versus neoplasm as well as right thyroid mass.  Patient was given IV fluid and referred for admission to the hospital.  Review of Systems: As per HPI. Otherwise, all other review of systems reviewed and are negative.   Past Medical History:  Diagnosis Date  .  Depression   . Murmur 12/06/2015    Past Surgical History:  Procedure Laterality Date  . ABDOMINAL HYSTERECTOMY    . APPENDECTOMY       reports that she quit smoking about 53 years ago. She has never used smokeless tobacco. She reports that she does not drink alcohol and does not use drugs.  No Known Allergies  Family History  Problem Relation Age of Onset  . Parkinson's disease Father   . Dementia Brother      Prior to Admission medications   Medication Sig Start Date End Date Taking? Authorizing Provider  acetaminophen (TYLENOL) 500 MG tablet Take 1,000 mg by mouth every 6 (six) hours as needed for mild pain, fever or headache.   Yes [provider]  amLODipine (NORVASC) 2.5 MG tablet TAKE 1 TABLET BY MOUTH EVERY DAY Patient taking differently: Take 2.5 mg by mouth daily. 01/18/20  Yes Jacelyn Pi, Lilia Argue, MD  aspirin EC 81 MG tablet Take 81 mg by mouth daily.    Yes [provider]  diclofenac (VOLTAREN) 50 MG EC tablet TAKE 1 TABLET BY MOUTH EVERYDAY AS NEEDED FOR MODERATE PAIN. Patient taking differently: Take 50 mg by mouth daily. Take 1 tablet by mouth everyday as needed for moderate pain. 11/27/19  Yes Jacelyn Pi, Lilia Argue, MD  Multiple Vitamin (MULTIVITAMIN WITH MINERALS) TABS tablet Take 1 tablet by mouth daily.   Yes [provider]    Physical Exam: Vitals:   04/18/20 1400 04/18/20 1430 04/18/20 1445 04/18/20 1500  BP:  131/71  (!) 127/52  Pulse: 87 77 84 70  Resp: Marland Kitchen)  23 18 (!) 21 16  Temp:      TempSrc:      SpO2: 96% 97% 99% 99%  Weight:      Height:         Constitutional: NAD, calm, comfortable Eyes: PERRL, lids and conjunctivae normal ENMT: Mucous membranes are dry Respiratory: Clear to auscultation bilaterally, no wheezing, no crackles. Normal respiratory effort. No accessory muscle use. No conversational dyspnea.  On room air Cardiovascular: Irregular rhythm.  + Mild pitting bilateral extremity edema.  Abdomen: Soft,  nondistended, nontender to palpation. Bowel sounds positive.  Musculoskeletal: No joint deformity upper and lower extremities. No contractures. Normal muscle tone.  Skin: no rashes, lesions, ulcers on exposed skin  Neurologic: Alert, no gross deficits Psychiatric: Poor historian  Labs on Admission: I have personally reviewed following labs and imaging studies  CBC: Recent Labs  Lab 04/18/20 1020 04/18/20 1300  WBC 20.3* 21.0*  NEUTROABS  --  16.8*  HGB 11.8 11.9*  HCT 35.7 37.3  MCV 87.2 88.2  PLT  --  528*   Basic Metabolic Panel: Recent Labs  Lab 04/18/20 1300  NA 135  K 4.4  CL 98  CO2 27  GLUCOSE 117*  BUN 26*  CREATININE 0.72  CALCIUM 11.5*  MG 2.1   GFR: Estimated Creatinine Clearance: 40.2 mL/min (by C-G formula based on SCr of 0.72 mg/dL). Liver Function Tests: Recent Labs  Lab 04/18/20 1300  AST 22  ALT 19  ALKPHOS 144*  BILITOT 0.3  PROT 7.8  ALBUMIN 2.9*   No results for input(s): LIPASE, AMYLASE in the last 168 hours. No results for input(s): AMMONIA in the last 168 hours. Coagulation Profile: No results for input(s): INR, PROTIME in the last 168 hours. Cardiac Enzymes: No results for input(s): CKTOTAL, CKMB, CKMBINDEX, TROPONINI in the last 168 hours. BNP (last 3 results) No results for input(s): PROBNP in the last 8760 hours. HbA1C: No results for input(s): HGBA1C in the last 72 hours. CBG: No results for input(s): GLUCAP in the last 168 hours. Lipid Profile: No results for input(s): CHOL, HDL, LDLCALC, TRIG, CHOLHDL, LDLDIRECT in the last 72 hours. Thyroid Function Tests: No results for input(s): TSH, T4TOTAL, FREET4, T3FREE, THYROIDAB in the last 72 hours. Anemia Panel: No results for input(s): VITAMINB12, FOLATE, FERRITIN, TIBC, IRON, RETICCTPCT in the last 72 hours. Urine analysis:    Component Value Date/Time   BILIRUBINUR negative 04/18/2020 1022   KETONESUR negative 04/18/2020 1022   PROTEINUR negative 04/18/2020 1022    UROBILINOGEN 0.2 04/18/2020 1022   NITRITE Negative 04/18/2020 1022   LEUKOCYTESUR Trace (A) 04/18/2020 1022   Sepsis Labs: !!!!!!!!!!!!!!!!!!!!!!!!!!!!!!!!!!!!!!!!!!!! @LABRCNTIP (procalcitonin:4,lacticidven:4) )No results found for this or any previous visit (from the past 240 hour(s)).   Radiological Exams on Admission: CT Chest W Contrast  Result Date: 04/18/2020 CLINICAL DATA:  Weight loss, back and flank pain. EXAM: CT CHEST, ABDOMEN, AND PELVIS WITH CONTRAST TECHNIQUE: Multidetector CT imaging of the chest, abdomen and pelvis was performed following the standard protocol during bolus administration of intravenous contrast. CONTRAST:  04/20/2020 OMNIPAQUE IOHEXOL 300 MG/ML  SOLN COMPARISON:  None. FINDINGS: CT CHEST FINDINGS Cardiovascular: Atherosclerosis of thoracic aorta is noted without aneurysm or dissection. Mild cardiomegaly is noted. No pericardial effusion is noted. Coronary artery calcifications are noted. Mediastinum/Nodes: 4.4 cm right thyroid mass is noted. No adenopathy is noted. The proximal esophagus is dilated. Moderate size sliding-type hiatal hernia is noted. It is also thickened and irregular in appearance and its distal portion concerning for possible  esophagitis or neoplasm. Lungs/Pleura: No pneumothorax or pleural effusion is noted. Calcified granuloma is noted in right middle lobe. Minimal bibasilar subsegmental atelectasis is noted. Musculoskeletal: No chest wall mass or suspicious bone lesions identified. CT ABDOMEN PELVIS FINDINGS Hepatobiliary: No focal liver abnormality is seen. No gallstones, gallbladder wall thickening, or biliary dilatation. Pancreas: Unremarkable. No pancreatic ductal dilatation or surrounding inflammatory changes. Spleen: Calcified splenic granulomata are noted. Adrenals/Urinary Tract: Adrenal glands are unremarkable. Kidneys are normal, without renal calculi, focal lesion, or hydronephrosis. Bladder is unremarkable. Stomach/Bowel: There is no evidence  of bowel obstruction or inflammation. Status post appendectomy. Vascular/Lymphatic: Aortic atherosclerosis. No enlarged abdominal or pelvic lymph nodes. Reproductive: Status post hysterectomy. No adnexal masses. Other: No abdominal wall hernia or abnormality. No abdominopelvic ascites. Musculoskeletal: No acute or significant osseous findings. IMPRESSION: 1. Moderate size sliding-type hiatal hernia is noted. The esophagus is also thickened and irregular in appearance in its distal portion concerning for possible esophagitis or neoplasm. Endoscopy is recommended for further evaluation. 2. 4.4 cm right thyroid mass is noted. Recommend thyroid US. (Ref: J Am Coll Radiol. 2015 Feb;12(2): 143-50). 3. Coronary artery calcifications are noted suggesting coronary artery disease. 4. No other significant abnormality seen in the chest, abdomen or pelvis. 5. Aortic atherosclerosis. Aortic Atherosclerosis (ICD10-I70.0). Electronically Signed   By: Lupita RaiderJames  Green Jr M.D.   On: 04/18/2020 14:48   CT Abdomen Pelvis W Contrast  Result Date: 04/18/2020 CLINICAL DATA:  Weight loss, back and flank pain. EXAM: CT CHEST, ABDOMEN, AND PELVIS WITH CONTRAST TECHNIQUE: Multidetector CT imaging of the chest, abdomen and pelvis was performed following the standard protocol during bolus administration of intravenous contrast. CONTRAST:  100mL OMNIPAQUE IOHEXOL 300 MG/ML  SOLN COMPARISON:  None. FINDINGS: CT CHEST FINDINGS Cardiovascular: Atherosclerosis of thoracic aorta is noted without aneurysm or dissection. Mild cardiomegaly is noted. No pericardial effusion is noted. Coronary artery calcifications are noted. Mediastinum/Nodes: 4.4 cm right thyroid mass is noted. No adenopathy is noted. The proximal esophagus is dilated. Moderate size sliding-type hiatal hernia is noted. It is also thickened and irregular in appearance and its distal portion concerning for possible esophagitis or neoplasm. Lungs/Pleura: No pneumothorax or pleural  effusion is noted. Calcified granuloma is noted in right middle lobe. Minimal bibasilar subsegmental atelectasis is noted. Musculoskeletal: No chest wall mass or suspicious bone lesions identified. CT ABDOMEN PELVIS FINDINGS Hepatobiliary: No focal liver abnormality is seen. No gallstones, gallbladder wall thickening, or biliary dilatation. Pancreas: Unremarkable. No pancreatic ductal dilatation or surrounding inflammatory changes. Spleen: Calcified splenic granulomata are noted. Adrenals/Urinary Tract: Adrenal glands are unremarkable. Kidneys are normal, without renal calculi, focal lesion, or hydronephrosis. Bladder is unremarkable. Stomach/Bowel: There is no evidence of bowel obstruction or inflammation. Status post appendectomy. Vascular/Lymphatic: Aortic atherosclerosis. No enlarged abdominal or pelvic lymph nodes. Reproductive: Status post hysterectomy. No adnexal masses. Other: No abdominal wall hernia or abnormality. No abdominopelvic ascites. Musculoskeletal: No acute or significant osseous findings. IMPRESSION: 1. Moderate size sliding-type hiatal hernia is noted. The esophagus is also thickened and irregular in appearance in its distal portion concerning for possible esophagitis or neoplasm. Endoscopy is recommended for further evaluation. 2. 4.4 cm right thyroid mass is noted. Recommend thyroid US. (Ref: J Am Coll Radiol. 2015 Feb;12(2): 143-50). 3. Coronary artery calcifications are noted suggesting coronary artery disease. 4. No other significant abnormality seen in the chest, abdomen or pelvis. 5. Aortic atherosclerosis. Aortic Atherosclerosis (ICD10-I70.0). Electronically Signed   By: Lupita RaiderJames  Green Jr M.D.   On: 04/18/2020 14:48    EKG:  Independently reviewed.  Sinus rhythm with RBBB  Assessment/Plan Active Problems:   Hypercalcemia   Hypercalcemia -Unknown etiology -Check labs including ionized calcium, PTH, vitamin D level, PTHrP  -IV fluid  Leukocytosis -Unclear that there is a  source of infection at this time, question if this could be secondary to dehydration. Patient has no complaints of fevers or chills or other complaints that could indicate underlying infection  -Monitor off of antibiotics for now, repeat labs in the morning -UA negative -Check blood cultures  -Covid pending  -IVF  Esophageal thickening and irregularity seen on CT abdomen -?Esophagitis vs neoplasm -GI consult for possible EGD  4.4cm right thyroid mass -Thyroid US -Check TSH  New onset A Fib -Check echo -Rate controlled for now  -CHA2DS2-VASc score 5  -Only on aspirin as outpatient, will need to address anticoagulation once above work up is complete  Chronic diastolic HF -Echo 0000000 showed EF 60 to 65% with grade 1 diastolic dysfunction -Repeat echo  Essential hypertension -Hold Norvasc in setting of bilateral lower extremity edema  Diet-controlled diabetes -Check Ha1c -SSI   Mild dementia -Stable     DVT prophylaxis: Lovenox   Code Status: DNR, confirmed with son HCPOA, at bedside  Family Communication: Son at bedside Disposition Plan: Pending clinical course Consults called: GI    Status is: Inpatient  Remains inpatient appropriate because:Ongoing diagnostic testing needed not appropriate for outpatient work up   Dispo: The patient is from: Home              Anticipated d/c is to: Home              Anticipated d/c date is: 3 days              Patient currently is not medically stable to d/c.   * I certify that at the point of admission it is my clinical judgment that the patient will require inpatient hospital care spanning beyond 2 midnights from the point of admission due to high intensity of service, high risk for further deterioration and high frequency of surveillance required.Dessa Phi, DO [facility] 04/18/2020, 4:23 PM   Available via Epic secure chat 7am-7pm After these hours, please refer to coverage provider listed on  [email or web address]

## 2020-04-18 NOTE — ED Notes (Addendum)
Floor stating they need to speak to hospitalist and [facility] regarding patient's admission status due to not having COVID swab result back. Unable to give report or take patient up at this time.

## 2020-04-18 NOTE — ED Notes (Addendum)
[facility] called. States that patient's scan is not stat, and her COVID result is not back, so scan will be done in the morning. Also received a call from lab, patient's result will not be available until 0200.

## 2020-04-18 NOTE — ED Triage Notes (Signed)
Pt presents with family with c/o abnormal lab. Family reports pt was told to report here for hypercalcemia. Pt c/o back pain at this time.

## 2020-04-18 NOTE — Progress Notes (Signed)
Anne Rice 86 y.o.   Chief Complaint  Patient presents with  . Fatigue    Pt has been declining since last OV she has been very fatigued   . Edema    Bilateral leg edema pt has been experiencing swelling in both legs   . Weight Loss    Pt has no appetite, declined worse over the past week, pt having pain and not wanting to eat. Pt has been given OTC tylenol to help but limited due to pts health     HISTORY OF PRESENT ILLNESS: This is a 86 y.o. female brought in by family members concerned about her declining mental status.  Very fatigued and tired.  Worsening leg edema. No appetite, very poor oral intake, possibly dehydrated. Chronic low back pain, not well controlled, daily pain poorly responsive to Tylenol.  Unable to control pain at home. Recent labs done earlier this month showed leukocytosis and hypercalcemia.  HPI   Prior to Admission medications   Medication Sig Start Date End Date Taking? Authorizing Provider  amLODipine (NORVASC) 2.5 MG tablet TAKE 1 TABLET BY MOUTH EVERY DAY 01/18/20  Yes Lezlie Lye, Meda Coffee, MD  aspirin EC 81 MG tablet Take 81 mg by mouth daily.    Yes [provider]  diclofenac (VOLTAREN) 50 MG EC tablet TAKE 1 TABLET BY MOUTH EVERYDAY AS NEEDED FOR MODERATE PAIN. 11/27/19  Yes Lezlie Lye, Meda Coffee, MD    No Known Allergies  Patient Active Problem List   Diagnosis Date Noted  . Murmur 12/06/2015  . Type 2 diabetes mellitus (HCC) 01/11/2014  . Hyperlipidemia LDL goal <100 01/11/2014  . Obesity, unspecified 01/11/2014  . Basal cell carcinoma of skin 09/04/2013  . HTN (hypertension) 05/18/2013  . Dementia (HCC) 05/18/2013    Past Medical History:  Diagnosis Date  . Depression   . Murmur 12/06/2015    Past Surgical History:  Procedure Laterality Date  . ABDOMINAL HYSTERECTOMY    . APPENDECTOMY      Social History   Socioeconomic History  . Marital status: Unknown    Spouse name: Not on file  . Number of children: Not  on file  . Years of education: Not on file  . Highest education level: Not on file  Occupational History  . Not on file  Tobacco Use  . Smoking status: Former Smoker    Quit date: 04/24/1967    Years since quitting: 53.0  . Smokeless tobacco: Never Used  Substance and Sexual Activity  . Alcohol use: No  . Drug use: No  . Sexual activity: Never  Other Topics Concern  . Not on file  Social History Narrative  . Not on file   Social Determinants of Health   Financial Resource Strain: Not on file  Food Insecurity: Not on file  Transportation Needs: Not on file  Physical Activity: Not on file  Stress: Not on file  Social Connections: Not on file  Intimate Partner Violence: Not on file    Family History  Problem Relation Age of Onset  . Parkinson's disease Father   . Dementia Brother      Review of Systems  Constitutional: Positive for malaise/fatigue. Negative for chills and fever.  HENT: Negative for congestion and sore throat.   Respiratory: Positive for cough.   Cardiovascular: Negative for chest pain.  Gastrointestinal: Negative for abdominal pain, diarrhea, nausea and vomiting.  Genitourinary: Negative for dysuria.  Musculoskeletal: Positive for back pain.  Skin: Negative for rash.  Neurological: Negative for dizziness and headaches.    Today's Vitals   04/18/20 0852  BP: 112/64  Pulse: 75  Resp: 16  Temp: 98.2 F (36.8 C)  TempSrc: Temporal  SpO2: 97%  Weight: 169 lb (76.7 kg)  Height: 5' (1.524 m)   Body mass index is 33.01 kg/m.  Physical Exam Vitals reviewed.  Constitutional:      Appearance: She is ill-appearing.     Comments: Somnolent but responsive to verbal stimuli.  HENT:     Head: Normocephalic.  Eyes:     Extraocular Movements: Extraocular movements intact.     Pupils: Pupils are equal, round, and reactive to light.  Cardiovascular:     Rate and Rhythm: Normal rate. Rhythm irregular.     Heart sounds: Murmur heard.    Pulmonary:      Effort: Pulmonary effort is normal.     Breath sounds: Normal breath sounds.  Abdominal:     Palpations: Abdomen is soft.     Tenderness: There is no abdominal tenderness.  Musculoskeletal:     Cervical back: Neck supple. No tenderness.     Right lower leg: Edema present.     Left lower leg: Edema present.  Lymphadenopathy:     Cervical: No cervical adenopathy.  Skin:    General: Skin is warm and dry.  Neurological:     General: No focal deficit present.    Results for orders placed or performed in visit on 04/18/20 (from the past 48 hour(s))  Calcium     Status: Abnormal   Collection Time: 04/18/20  9:50 AM  Result Value Ref Range   Calcium 11.7 (H) 8.7 - 10.3 mg/dL  POCT Microscopic Urinalysis ([facility])     Status: Abnormal   Collection Time: 04/18/20 10:20 AM  Result Value Ref Range   WBC,UR,HPF,POC Few (A) None WBC/hpf   RBC,UR,HPF,POC None None RBC/hpf   Bacteria Few (A) None, Too numerous to count   Mucus Absent Absent   Epithelial Cells, UR Per Microscopy Few (A) None, Too numerous to count cells/hpf  POCT CBC     Status: Abnormal   Collection Time: 04/18/20 10:20 AM  Result Value Ref Range   WBC 20.3 (A) 4.6 - 10.2 K/uL   Lymph, poc 2.3 0.6 - 3.4   POC LYMPH PERCENT 11.1 10 - 50 %L   MID (cbc) 1.7 (A) 0 - 0.9   POC MID % 8.4 0 - 12 %M   POC Granulocyte 16.3 (A) 2 - 6.9   Granulocyte percent 80.5 (A) 37 - 80 %G   RBC 4.09 4.04 - 5.48 M/uL   Hemoglobin 11.8 11 - 14.6 g/dL   HCT, POC 35.7 29 - 41 %   MCV 87.2 76 - 111 fL   MCH, POC 28.9 27 - 31.2 pg   MCHC 33.2 31.8 - 35.4 g/dL   RDW, POC 14.9 %   Platelet Count, POC 610 (A) 142 - 424 K/uL   MPV 7.7 0 - 99.8 fL  POCT urinalysis dipstick     Status: Abnormal   Collection Time: 04/18/20 10:22 AM  Result Value Ref Range   Color, UA yellow yellow   Clarity, UA clear clear   Glucose, UA negative negative mg/dL   Bilirubin, UA negative negative   Ketones, POC UA negative negative mg/dL   Spec Grav, UA >=1.030  (A) 1.010 - 1.025   Blood, UA negative negative   pH, UA 5.5 5.0 - 8.0   Protein Ur, POC  negative negative mg/dL   Urobilinogen, UA 0.2 0.2 or 1.0 E.U./dL   Nitrite, UA Negative Negative   Leukocytes, UA Trace (A) Negative     ASSESSMENT & PLAN: Clinically unstable.  Dehydration with possible sepsis and hypercalcemia. Intractable low back pain. Needs further evaluation and treatment in the emergency department as well as admission to the hospital. Differential diagnosis discussed with family members.  DNR wishes noted. Natelie was seen today for fatigue, edema and weight loss.  Diagnoses and all orders for this visit:  Dehydration  Hypercalcemia  Leukocytosis, unspecified type Comments: Possible sepsis  Obtundation  Intractable low back pain  Cough  Suspected UTI  Dementia without behavioral disturbance, unspecified dementia type ([place])  Hyperlipidemia associated with type 2 diabetes mellitus ([place])  Hypertension associated with diabetes [facility])     Patient Instructions   To hospital now for further evaluation and treatment.    If you have lab work done today you will be contacted with your lab results within the next 2 weeks.  If you have not heard from [place] then please contact us. The fastest way to get your results is to register for My Chart.   IF you received an x-ray today, you will receive an invoice from [facility]. Please contact [facility] at [phone] with questions or concerns regarding your invoice.   IF you received labwork today, you will receive an invoice from [facility]. Please contact [facility] at [phone] with questions or concerns regarding your invoice.   Our billing staff will not be able to assist you with questions regarding bills from these companies.  You will be contacted with the lab results as soon as they are available. The fastest way to get your results is to activate your My Chart account. Instructions are  located on the last page of this paperwork. If you have not heard from [place] regarding the results in 2 weeks, please contact this office.     Hypercalcemia Hypercalcemia is when the level of calcium in a person's blood is above normal. The body needs calcium to make bones and keep them strong. Calcium also helps the muscles, nerves, brain, and heart work the way they should. Most of the calcium in the body is in the bones. There is also some calcium in the blood. Hypercalcemia can happen when calcium comes out of the bones, or when the kidneys are not able to remove calcium from the blood. Hypercalcemia can be mild or severe. What are the causes? There are many possible causes of hypercalcemia. Common causes of this condition include:  Hyperparathyroidism. This is a condition in which the body produces too much parathyroid hormone. There are four parathyroid glands in your neck. These glands produce a chemical messenger (hormone) that helps the body absorb calcium from foods and helps your bones release calcium.  Certain kinds of cancer. Less common causes of hypercalcemia include:  Getting too much calcium or vitamin D from your diet.  Kidney failure.  Hyperthyroidism.  Severe dehydration.  Being on bed rest or being inactive for a long time.  Certain medicines.  Infections. What increases the risk? You are more likely to develop this condition if you:  Are female.  Are 25 years of age or older.  Have a family history of hypercalcemia. What are the signs or symptoms? Mild hypercalcemia that starts slowly may not cause symptoms. Severe, sudden hypercalcemia is more likely to cause symptoms, such as:  Being more thirsty than usual.  Needing to urinate more  often than usual.  Abdominal pain.  Nausea and vomiting.  Constipation.  Muscle pain, twitching, or weakness.  Feeling very tired. How is this diagnosed?  Hypercalcemia is usually diagnosed with a blood test. You  may also have tests to help determine what is causing this condition, such as imaging tests and more blood tests. How is this treated? Treatment for hypercalcemia depends on the cause. Treatment may include:  Receiving fluids through an IV.  Medicines that: ? Keep calcium levels steady after receiving fluids (loop diuretics). ? Keep calcium in your bones (bisphosphonates). ? Lower the calcium level in your blood.  Surgery to remove overactive parathyroid glands.  A procedure that filters your blood to correct calcium levels (hemodialysis). Follow these instructions at home:   Take over-the-counter and prescription medicines only as told by your health care provider.  Follow instructions from your health care provider about eating or drinking restrictions.  Drink enough fluid to keep your urine pale yellow.  Stay active. Weight-bearing exercise helps to keep calcium in your bones. Follow instructions from your health care provider about what type and level of exercise is safe for you.  Keep all follow-up visits as told by your health care provider. This is important. Contact a health care provider if you have:  A fever.  A heartbeat that is irregular or very fast.  Changes in mood, memory, or personality. Get help right away if you:  Have severe abdominal pain.  Have chest pain.  Have trouble breathing.  Become very confused and sleepy.  Lose consciousness. Summary  Hypercalcemia is when the level of calcium in a person's blood is above normal. The body needs calcium to make bones and keep them strong. Calcium also helps the muscles, nerves, brain, and heart work the way they should.  There are many possible causes of hypercalcemia, and treatment depends on the cause.  Take over-the-counter and prescription medicines only as told by your health care provider.  Follow instructions from your health care provider about eating or drinking restrictions. This information  is not intended to replace advice given to you by your health care provider. Make sure you discuss any questions you have with your health care provider. Document Revised: 05/06/2018 Document Reviewed: 01/13/2018 Elsevier Patient Education  [place].  Dehydration, Adult Dehydration is condition in which there is not enough water or other fluids in the body. This happens when a person loses more fluids than he or she takes in. Important body parts cannot work right without the right amount of fluids. Any loss of fluids from the body can cause dehydration. Dehydration can be mild, worse, or very bad. It should be treated right away to keep it from getting very bad. What are the causes? This condition may be caused by:  Conditions that cause loss of water or other fluids, such as: ? Watery poop (diarrhea). ? Vomiting. ? Sweating a lot. ? Peeing (urinating) a lot.  Not drinking enough fluids, especially when you: ? Are ill. ? Are doing things that take a lot of energy to do.  Other illnesses and conditions, such as fever or infection.  Certain medicines, such as medicines that take extra fluid out of the body (diuretics).  Lack of safe drinking water.  Not being able to get enough water and food. What increases the risk? The following factors may make you more likely to develop this condition:  Having a long-term (chronic) illness that has not been treated the right way, such  as: ? Diabetes. ? Heart disease. ? Kidney disease.  Being 19 years of age or older.  Having a disability.  Living in a place that is high above the ground or sea (high in altitude). The thinner, dried air causes more fluid loss.  Doing exercises that put stress on your body for a long time. What are the signs or symptoms? Symptoms of dehydration depend on how bad it is. Mild or worse dehydration  Thirst.  Dry lips or dry mouth.  Feeling dizzy or light-headed, especially when you stand up  from sitting.  Muscle cramps.  Your body making: ? Dark pee (urine). Pee may be the color of tea. ? Less pee than normal. ? Less tears than normal.  Headache. Very bad dehydration  Changes in skin. Skin may: ? Be cold to the touch (clammy). ? Be blotchy or pale. ? Not go back to normal right after you lightly pinch it and let it go.  Little or no tears, pee, or sweat.  Changes in vital signs, such as: ? Fast breathing. ? Low blood pressure. ? Weak pulse. ? Pulse that is more than 100 beats a minute when you are sitting still.  Other changes, such as: ? Feeling very thirsty. ? Eyes that look hollow (sunken). ? Cold hands and feet. ? Being mixed up (confused). ? Being very tired (lethargic) or having trouble waking from sleep. ? Short-term weight loss. ? Loss of consciousness. How is this treated? Treatment for this condition depends on how bad it is. Treatment should start right away. Do not wait until your condition gets very bad. Very bad dehydration is an emergency. You will need to go to a hospital.  Mild or worse dehydration can be treated at home. You may be asked to: ? Drink more fluids. ? Drink an oral rehydration solution (ORS). This drink helps get the right amounts of fluids and salts and minerals in the blood (electrolytes).  Very bad dehydration can be treated: ? With fluids through an IV tube. ? By getting normal levels of salts and minerals in your blood. This is often done by giving salts and minerals through a tube. The tube is passed through your nose and into your stomach. ? By treating the root cause. Follow these instructions at home: Oral rehydration solution If told by your doctor, drink an ORS:  Make an ORS. Use instructions on the package.  Start by drinking small amounts, about  cup (120 mL) every 5-10 minutes.  Slowly drink more until you have had the amount that your doctor said to have. Eating and drinking         Drink enough  clear fluid to keep your pee pale yellow. If you were told to drink an ORS, finish the ORS first. Then, start slowly drinking other clear fluids. Drink fluids such as: ? Water. Do not drink only water. Doing that can make the salt (sodium) level in your body get too low. ? Water from ice chips you suck on. ? Fruit juice that you have added water to (diluted). ? Low-calorie sports drinks.  Eat foods that have the right amounts of salts and minerals, such as: ? Bananas. ? Oranges. ? Potatoes. ? Tomatoes. ? Spinach.  Do not drink alcohol.  Avoid: ? Drinks that have a lot of sugar. These include:  High-calorie sports drinks.  Fruit juice that you did not add water to.  Soda.  Caffeine. ? Foods that are greasy or have a lot  of fat or sugar. General instructions  Take over-the-counter and prescription medicines only as told by your doctor.  Do not take salt tablets. Doing that can make the salt level in your body get too high.  Return to your normal activities as told by your doctor. Ask your doctor what activities are safe for you.  Keep all follow-up visits as told by your doctor. This is important. Contact a doctor if:  You have pain in your belly (abdomen) and the pain: ? Gets worse. ? Stays in one place.  You have a rash.  You have a stiff neck.  You get angry or annoyed (irritable) more easily than normal.  You are more tired or have a harder time waking than normal.  You feel: ? Weak or dizzy. ? Very thirsty. Get help right away if you have:  Any symptoms of very bad dehydration.  Symptoms of vomiting, such as: ? You cannot eat or drink without vomiting. ? Your vomiting gets worse or does not go away. ? Your vomit has blood or green stuff in it.  Symptoms that get worse with treatment.  A fever.  A very bad headache.  Problems with peeing or pooping (having a bowel movement), such as: ? Watery poop that gets worse or does not go away. ? Blood in  your poop (stool). This may cause poop to look black and tarry. ? Not peeing in 6-8 hours. ? Peeing only a small amount of very dark pee in 6-8 hours.  Trouble breathing. These symptoms may be an emergency. Do not wait to see if the symptoms will go away. Get medical help right away. Call your local emergency services (911 in the [place] Do not drive yourself to the hospital. Summary  Dehydration is a condition in which there is not enough water or other fluids in the body. This happens when a person loses more fluids than he or she takes in.  Treatment for this condition depends on how bad it is. Treatment should be started right away. Do not wait until your condition gets very bad.  Drink enough clear fluid to keep your pee pale yellow. If you were told to drink an oral rehydration solution (ORS), finish the ORS first. Then, start slowly drinking other clear fluids.  Take over-the-counter and prescription medicines only as told by your doctor.  Get help right away if you have any symptoms of very bad dehydration. This information is not intended to replace advice given to you by your health care provider. Make sure you discuss any questions you have with your health care provider. Document Revised: 11/20/2018 Document Reviewed: 11/20/2018 Elsevier Patient Education  2020 [employer]      Agustina Caroli, MD [facility]

## 2020-04-18 NOTE — ED Provider Notes (Signed)
[facility] DEPT Provider Note   CSN: [id number] Arrival date & time: 04/18/20  1004     History Chief Complaint  Patient presents with  . Abnormal Lab    Anne Rice is a 86 y.o. female.  The history is provided by the patient, a relative and medical records.   Anne Rice is a 86 y.o. female who presents to the Emergency Department complaining of abnormal lab.  She presents to the ED accompanied by her son for evaluation of hypercalcemia.  Hx is provided by the patient's son.  She has been doing poorly for the last several weeks to months.  She has very poor appetite and is eating very little.  She has less energy.  Lives with son.  She has been complaining of back pain for about two weeks.  Normally she walks around the house but lately needs assistance with ambulation.  No new injuries.    No fevers, vomiting, diarrhea, sob, abdominal pain.  She has been taking tylenol for back pain with no significant improvement.  She has been fully vaccinated for covid19.      Past Medical History:  Diagnosis Date  . Depression   . Murmur 12/06/2015    Patient Active Problem List   Diagnosis Date Noted  . Hypercalcemia 04/18/2020  . Murmur 12/06/2015  . Type 2 diabetes mellitus ([place]) 01/11/2014  . Hyperlipidemia LDL goal <100 01/11/2014  . Obesity, unspecified 01/11/2014  . Basal cell carcinoma of skin 09/04/2013  . HTN (hypertension) 05/18/2013  . Dementia ([place]) 05/18/2013    Past Surgical History:  Procedure Laterality Date  . ABDOMINAL HYSTERECTOMY    . APPENDECTOMY       OB History   No obstetric history on file.     Family History  Problem Relation Age of Onset  . Parkinson's disease Father   . Dementia Brother     Social History   Tobacco Use  . Smoking status: Former Smoker    Quit date: 04/24/1967    Years since quitting: 53.0  . Smokeless tobacco: Never Used  Substance Use Topics  . Alcohol use: No  . Drug use: No     Home Medications Prior to Admission medications   Medication Sig Start Date End Date Taking? Authorizing Provider  acetaminophen (TYLENOL) 500 MG tablet Take 1,000 mg by mouth every 6 (six) hours as needed for mild pain, fever or headache.   Yes [provider]  amLODipine (NORVASC) 2.5 MG tablet TAKE 1 TABLET BY MOUTH EVERY DAY Patient taking differently: Take 2.5 mg by mouth daily. 01/18/20  Yes Jacelyn Pi, Lilia Argue, MD  aspirin EC 81 MG tablet Take 81 mg by mouth daily.    Yes [provider]  diclofenac (VOLTAREN) 50 MG EC tablet TAKE 1 TABLET BY MOUTH EVERYDAY AS NEEDED FOR MODERATE PAIN. Patient taking differently: Take 50 mg by mouth daily. Take 1 tablet by mouth everyday as needed for moderate pain. 11/27/19  Yes Jacelyn Pi, Lilia Argue, MD  Multiple Vitamin (MULTIVITAMIN WITH MINERALS) TABS tablet Take 1 tablet by mouth daily.   Yes [provider]    Allergies    Patient has no known allergies.  Review of Systems   Review of Systems  All other systems reviewed and are negative.   Physical Exam Updated Vital Signs BP (!) 127/52   Pulse 70   Temp 98.1 F (36.7 C) (Oral)   Resp 16   Ht 5' (1.524 m)  Wt 76.7 kg   SpO2 99%   BMI 33.01 kg/m   Physical Exam Vitals and nursing note reviewed.  Constitutional:      Appearance: She is well-developed and well-nourished.  HENT:     Head: Normocephalic and atraumatic.  Cardiovascular:     Rate and Rhythm: Normal rate and regular rhythm.     Heart sounds: No murmur heard.   Pulmonary:     Effort: Pulmonary effort is normal. No respiratory distress.     Breath sounds: Normal breath sounds.  Abdominal:     Palpations: Abdomen is soft.     Tenderness: There is no abdominal tenderness. There is no guarding or rebound.  Musculoskeletal:        General: No edema.     Comments: Point tenderness to palpation over the lower thoracic/upper lumbar spine.  Skin:    General: Skin is warm and dry.   Neurological:     Mental Status: She is alert.     Comments: Oriented to person in place, disoriented to time. Generalized weakness. Very hard of hearing  Psychiatric:        Mood and Affect: Mood and affect normal.        Behavior: Behavior normal.     ED Results / Procedures / Treatments   Labs (all labs ordered are listed, but only abnormal results are displayed) Labs Reviewed  COMPREHENSIVE METABOLIC PANEL - Abnormal; Notable for the following components:      Result Value   Glucose, Bld 117 (*)    BUN 26 (*)    Calcium 11.5 (*)    Albumin 2.9 (*)    Alkaline Phosphatase 144 (*)    All other components within normal limits  CBC WITH DIFFERENTIAL/PLATELET - Abnormal; Notable for the following components:   WBC 21.0 (*)    Hemoglobin 11.9 (*)    Platelets 528 (*)    Neutro Abs 16.8 (*)    Monocytes Absolute 1.6 (*)    Abs Immature Granulocytes 0.09 (*)    All other components within normal limits  URINE CULTURE  SARS CORONAVIRUS 2 (TAT 6-24 HRS)  MAGNESIUM  URINALYSIS, ROUTINE W REFLEX MICROSCOPIC  CALCIUM, IONIZED  PTH, INTACT AND CALCIUM  PTH-RELATED PEPTIDE  VITAMIN D 25 HYDROXY (VIT D DEFICIENCY, FRACTURES)  CALCITRIOL (1,25 DI-OH VIT D)  TSH    EKG EKG Interpretation  Date/Time:  Monday April 18 2020 12:54:01 EST Ventricular Rate:  69 PR Interval:    QRS Duration: 115 QT Interval:  376 QTC Calculation: 403 R Axis:   17 Text Interpretation: Sinus rhythm Incomplete right bundle branch block Low voltage, precordial leads Minimal ST elevation, inferior leads No previous tracing Confirmed by Quintella Reichert [phone]) on 04/18/2020 1:03:10 PM   Radiology CT Chest W Contrast  Result Date: 04/18/2020 CLINICAL DATA:  Weight loss, back and flank pain. EXAM: CT CHEST, ABDOMEN, AND PELVIS WITH CONTRAST TECHNIQUE: Multidetector CT imaging of the chest, abdomen and pelvis was performed following the standard protocol during bolus administration of intravenous  contrast. CONTRAST:  172mL OMNIPAQUE IOHEXOL 300 MG/ML  SOLN COMPARISON:  None. FINDINGS: CT CHEST FINDINGS Cardiovascular: Atherosclerosis of thoracic aorta is noted without aneurysm or dissection. Mild cardiomegaly is noted. No pericardial effusion is noted. Coronary artery calcifications are noted. Mediastinum/Nodes: 4.4 cm right thyroid mass is noted. No adenopathy is noted. The proximal esophagus is dilated. Moderate size sliding-type hiatal hernia is noted. It is also thickened and irregular in appearance and its distal portion concerning for  possible esophagitis or neoplasm. Lungs/Pleura: No pneumothorax or pleural effusion is noted. Calcified granuloma is noted in right middle lobe. Minimal bibasilar subsegmental atelectasis is noted. Musculoskeletal: No chest wall mass or suspicious bone lesions identified. CT ABDOMEN PELVIS FINDINGS Hepatobiliary: No focal liver abnormality is seen. No gallstones, gallbladder wall thickening, or biliary dilatation. Pancreas: Unremarkable. No pancreatic ductal dilatation or surrounding inflammatory changes. Spleen: Calcified splenic granulomata are noted. Adrenals/Urinary Tract: Adrenal glands are unremarkable. Kidneys are normal, without renal calculi, focal lesion, or hydronephrosis. Bladder is unremarkable. Stomach/Bowel: There is no evidence of bowel obstruction or inflammation. Status post appendectomy. Vascular/Lymphatic: Aortic atherosclerosis. No enlarged abdominal or pelvic lymph nodes. Reproductive: Status post hysterectomy. No adnexal masses. Other: No abdominal wall hernia or abnormality. No abdominopelvic ascites. Musculoskeletal: No acute or significant osseous findings. IMPRESSION: 1. Moderate size sliding-type hiatal hernia is noted. The esophagus is also thickened and irregular in appearance in its distal portion concerning for possible esophagitis or neoplasm. Endoscopy is recommended for further evaluation. 2. 4.4 cm right thyroid mass is noted. Recommend  thyroid US. (Ref: J Am Coll Radiol. 2015 Feb;12(2): 143-50). 3. Coronary artery calcifications are noted suggesting coronary artery disease. 4. No other significant abnormality seen in the chest, abdomen or pelvis. 5. Aortic atherosclerosis. Aortic Atherosclerosis (ICD10-I70.0). Electronically Signed   By: Lupita Raider M.D.   On: 04/18/2020 14:48   CT Abdomen Pelvis W Contrast  Result Date: 04/18/2020 CLINICAL DATA:  Weight loss, back and flank pain. EXAM: CT CHEST, ABDOMEN, AND PELVIS WITH CONTRAST TECHNIQUE: Multidetector CT imaging of the chest, abdomen and pelvis was performed following the standard protocol during bolus administration of intravenous contrast. CONTRAST:  OMNIPAQUE IOHEXOL 300 MG/ML  SOLN COMPARISON:  None. FINDINGS: CT CHEST FINDINGS Cardiovascular: Atherosclerosis of thoracic aorta is noted without aneurysm or dissection. Mild cardiomegaly is noted. No pericardial effusion is noted. Coronary artery calcifications are noted. Mediastinum/Nodes: 4.4 cm right thyroid mass is noted. No adenopathy is noted. The proximal esophagus is dilated. Moderate size sliding-type hiatal hernia is noted. It is also thickened and irregular in appearance and its distal portion concerning for possible esophagitis or neoplasm. Lungs/Pleura: No pneumothorax or pleural effusion is noted. Calcified granuloma is noted in right middle lobe. Minimal bibasilar subsegmental atelectasis is noted. Musculoskeletal: No chest wall mass or suspicious bone lesions identified. CT ABDOMEN PELVIS FINDINGS Hepatobiliary: No focal liver abnormality is seen. No gallstones, gallbladder wall thickening, or biliary dilatation. Pancreas: Unremarkable. No pancreatic ductal dilatation or surrounding inflammatory changes. Spleen: Calcified splenic granulomata are noted. Adrenals/Urinary Tract: Adrenal glands are unremarkable. Kidneys are normal, without renal calculi, focal lesion, or hydronephrosis. Bladder is unremarkable.  Stomach/Bowel: There is no evidence of bowel obstruction or inflammation. Status post appendectomy. Vascular/Lymphatic: Aortic atherosclerosis. No enlarged abdominal or pelvic lymph nodes. Reproductive: Status post hysterectomy. No adnexal masses. Other: No abdominal wall hernia or abnormality. No abdominopelvic ascites. Musculoskeletal: No acute or significant osseous findings. IMPRESSION: 1. Moderate size sliding-type hiatal hernia is noted. The esophagus is also thickened and irregular in appearance in its distal portion concerning for possible esophagitis or neoplasm. Endoscopy is recommended for further evaluation. 2. 4.4 cm right thyroid mass is noted. Recommend thyroid US. (Ref: J Am Coll Radiol. 2015 Feb;12(2): 143-50). 3. Coronary artery calcifications are noted suggesting coronary artery disease. 4. No other significant abnormality seen in the chest, abdomen or pelvis. 5. Aortic atherosclerosis. Aortic Atherosclerosis (ICD10-I70.0). Electronically Signed   By: Lupita Raider M.D.   On: 04/18/2020 14:48  Procedures Procedures (including critical care time)  Medications Ordered in ED Medications  0.9 %  sodium chloride infusion (has no administration in time range)  iohexol (OMNIPAQUE) 300 MG/ML solution 100 mL (100 mLs Intravenous Contrast Given 04/18/20 1419)  sodium chloride 0.9 % bolus 500 mL (500 mLs Intravenous New Bag/Given 04/18/20 1450)  sodium chloride 0.9 % bolus 500 mL (500 mLs Intravenous New Bag/Given 04/18/20 1530)    ED Course  I have reviewed the triage vital signs and the nursing notes.  Pertinent labs & imaging results that were available during my care of the patient were reviewed by me and considered in my medical decision making (see chart for details).    MDM Rules/Calculators/A&P                         patient referred to the emergency department from PCPs office for hypercalcemia, progressive weakness and poor appetite. She has generalized weakness on  examination with no focal neurologic deficits. Labs significant for hypercalcemia, mild elevation and BUN. CBC with significant leukocytosis, unclear source of the leukocytosis as there is no clear evidence of infection at this time. Labs reviewed from PCP office, she had a urinalysis performed today that is not consistent with UTI, will recollect urine in the emergency department and send a culture. No evidence of pneumonia. Given her back pain CT chest abdomen pelvis was obtained to rule out mass. CT does demonstrate thyroid mass as well as esophagitis versus esophageal mass. She was treated with IV fluids for dehydration hypercalcemia, hospitalist consulted for admission for ongoing treatment. Patient and son updated of findings of studies and recommendation for admission and they are in agreement with plan.  Final Clinical Impression(s) / ED Diagnoses Final diagnoses:  Hypercalcemia  Other acute back pain    Rx / DC Orders ED Discharge Orders    None       Quintella Reichert, MD 04/18/20 1547

## 2020-04-18 NOTE — ED Notes (Signed)
Changed pt linen changed purewick

## 2020-04-18 NOTE — Patient Instructions (Addendum)
To hospital now for further evaluation and treatment.    If you have lab work done today you will be contacted with your lab results within the next 2 weeks.  If you have not heard from [place] then please contact us. The fastest way to get your results is to register for My Chart.   IF you received an x-ray today, you will receive an invoice from [facility]. Please contact [facility] at [phone] with questions or concerns regarding your invoice.   IF you received labwork today, you will receive an invoice from [facility]. Please contact [facility] at [phone] with questions or concerns regarding your invoice.   Our billing staff will not be able to assist you with questions regarding bills from these companies.  You will be contacted with the lab results as soon as they are available. The fastest way to get your results is to activate your My Chart account. Instructions are located on the last page of this paperwork. If you have not heard from [place] regarding the results in 2 weeks, please contact this office.     Hypercalcemia Hypercalcemia is when the level of calcium in a person's blood is above normal. The body needs calcium to make bones and keep them strong. Calcium also helps the muscles, nerves, brain, and heart work the way they should. Most of the calcium in the body is in the bones. There is also some calcium in the blood. Hypercalcemia can happen when calcium comes out of the bones, or when the kidneys are not able to remove calcium from the blood. Hypercalcemia can be mild or severe. What are the causes? There are many possible causes of hypercalcemia. Common causes of this condition include:  Hyperparathyroidism. This is a condition in which the body produces too much parathyroid hormone. There are four parathyroid glands in your neck. These glands produce a chemical messenger (hormone) that helps the body absorb calcium from foods and helps your bones release  calcium.  Certain kinds of cancer. Less common causes of hypercalcemia include:  Getting too much calcium or vitamin D from your diet.  Kidney failure.  Hyperthyroidism.  Severe dehydration.  Being on bed rest or being inactive for a long time.  Certain medicines.  Infections. What increases the risk? You are more likely to develop this condition if you:  Are female.  Are 31 years of age or older.  Have a family history of hypercalcemia. What are the signs or symptoms? Mild hypercalcemia that starts slowly may not cause symptoms. Severe, sudden hypercalcemia is more likely to cause symptoms, such as:  Being more thirsty than usual.  Needing to urinate more often than usual.  Abdominal pain.  Nausea and vomiting.  Constipation.  Muscle pain, twitching, or weakness.  Feeling very tired. How is this diagnosed?  Hypercalcemia is usually diagnosed with a blood test. You may also have tests to help determine what is causing this condition, such as imaging tests and more blood tests. How is this treated? Treatment for hypercalcemia depends on the cause. Treatment may include:  Receiving fluids through an IV.  Medicines that: ? Keep calcium levels steady after receiving fluids (loop diuretics). ? Keep calcium in your bones (bisphosphonates). ? Lower the calcium level in your blood.  Surgery to remove overactive parathyroid glands.  A procedure that filters your blood to correct calcium levels (hemodialysis). Follow these instructions at home:   Take over-the-counter and prescription medicines only as told by your health care provider.  Follow instructions from your health care provider about eating or drinking restrictions.  Drink enough fluid to keep your urine pale yellow.  Stay active. Weight-bearing exercise helps to keep calcium in your bones. Follow instructions from your health care provider about what type and level of exercise is safe for  you.  Keep all follow-up visits as told by your health care provider. This is important. Contact a health care provider if you have:  A fever.  A heartbeat that is irregular or very fast.  Changes in mood, memory, or personality. Get help right away if you:  Have severe abdominal pain.  Have chest pain.  Have trouble breathing.  Become very confused and sleepy.  Lose consciousness. Summary  Hypercalcemia is when the level of calcium in a person's blood is above normal. The body needs calcium to make bones and keep them strong. Calcium also helps the muscles, nerves, brain, and heart work the way they should.  There are many possible causes of hypercalcemia, and treatment depends on the cause.  Take over-the-counter and prescription medicines only as told by your health care provider.  Follow instructions from your health care provider about eating or drinking restrictions. This information is not intended to replace advice given to you by your health care provider. Make sure you discuss any questions you have with your health care provider. Document Revised: 05/06/2018 Document Reviewed: 01/13/2018 Elsevier Patient Education  [place].  Dehydration, Adult Dehydration is condition in which there is not enough water or other fluids in the body. This happens when a person loses more fluids than he or she takes in. Important body parts cannot work right without the right amount of fluids. Any loss of fluids from the body can cause dehydration. Dehydration can be mild, worse, or very bad. It should be treated right away to keep it from getting very bad. What are the causes? This condition may be caused by:  Conditions that cause loss of water or other fluids, such as: ? Watery poop (diarrhea). ? Vomiting. ? Sweating a lot. ? Peeing (urinating) a lot.  Not drinking enough fluids, especially when you: ? Are ill. ? Are doing things that take a lot of energy to  do.  Other illnesses and conditions, such as fever or infection.  Certain medicines, such as medicines that take extra fluid out of the body (diuretics).  Lack of safe drinking water.  Not being able to get enough water and food. What increases the risk? The following factors may make you more likely to develop this condition:  Having a long-term (chronic) illness that has not been treated the right way, such as: ? Diabetes. ? Heart disease. ? Kidney disease.  Being 81 years of age or older.  Having a disability.  Living in a place that is high above the ground or sea (high in altitude). The thinner, dried air causes more fluid loss.  Doing exercises that put stress on your body for a long time. What are the signs or symptoms? Symptoms of dehydration depend on how bad it is. Mild or worse dehydration  Thirst.  Dry lips or dry mouth.  Feeling dizzy or light-headed, especially when you stand up from sitting.  Muscle cramps.  Your body making: ? Dark pee (urine). Pee may be the color of tea. ? Less pee than normal. ? Less tears than normal.  Headache. Very bad dehydration  Changes in skin. Skin may: ? Be cold to the touch (clammy). ?  Be blotchy or pale. ? Not go back to normal right after you lightly pinch it and let it go.  Little or no tears, pee, or sweat.  Changes in vital signs, such as: ? Fast breathing. ? Low blood pressure. ? Weak pulse. ? Pulse that is more than 100 beats a minute when you are sitting still.  Other changes, such as: ? Feeling very thirsty. ? Eyes that look hollow (sunken). ? Cold hands and feet. ? Being mixed up (confused). ? Being very tired (lethargic) or having trouble waking from sleep. ? Short-term weight loss. ? Loss of consciousness. How is this treated? Treatment for this condition depends on how bad it is. Treatment should start right away. Do not wait until your condition gets very bad. Very bad dehydration is an  emergency. You will need to go to a hospital.  Mild or worse dehydration can be treated at home. You may be asked to: ? Drink more fluids. ? Drink an oral rehydration solution (ORS). This drink helps get the right amounts of fluids and salts and minerals in the blood (electrolytes).  Very bad dehydration can be treated: ? With fluids through an IV tube. ? By getting normal levels of salts and minerals in your blood. This is often done by giving salts and minerals through a tube. The tube is passed through your nose and into your stomach. ? By treating the root cause. Follow these instructions at home: Oral rehydration solution If told by your doctor, drink an ORS:  Make an ORS. Use instructions on the package.  Start by drinking small amounts, about  cup (120 mL) every 5-10 minutes.  Slowly drink more until you have had the amount that your doctor said to have. Eating and drinking         Drink enough clear fluid to keep your pee pale yellow. If you were told to drink an ORS, finish the ORS first. Then, start slowly drinking other clear fluids. Drink fluids such as: ? Water. Do not drink only water. Doing that can make the salt (sodium) level in your body get too low. ? Water from ice chips you suck on. ? Fruit juice that you have added water to (diluted). ? Low-calorie sports drinks.  Eat foods that have the right amounts of salts and minerals, such as: ? Bananas. ? Oranges. ? Potatoes. ? Tomatoes. ? Spinach.  Do not drink alcohol.  Avoid: ? Drinks that have a lot of sugar. These include:  High-calorie sports drinks.  Fruit juice that you did not add water to.  Soda.  Caffeine. ? Foods that are greasy or have a lot of fat or sugar. General instructions  Take over-the-counter and prescription medicines only as told by your doctor.  Do not take salt tablets. Doing that can make the salt level in your body get too high.  Return to your normal activities as  told by your doctor. Ask your doctor what activities are safe for you.  Keep all follow-up visits as told by your doctor. This is important. Contact a doctor if:  You have pain in your belly (abdomen) and the pain: ? Gets worse. ? Stays in one place.  You have a rash.  You have a stiff neck.  You get angry or annoyed (irritable) more easily than normal.  You are more tired or have a harder time waking than normal.  You feel: ? Weak or dizzy. ? Very thirsty. Get help right away if you  have:  Any symptoms of very bad dehydration.  Symptoms of vomiting, such as: ? You cannot eat or drink without vomiting. ? Your vomiting gets worse or does not go away. ? Your vomit has blood or green stuff in it.  Symptoms that get worse with treatment.  A fever.  A very bad headache.  Problems with peeing or pooping (having a bowel movement), such as: ? Watery poop that gets worse or does not go away. ? Blood in your poop (stool). This may cause poop to look black and tarry. ? Not peeing in 6-8 hours. ? Peeing only a small amount of very dark pee in 6-8 hours.  Trouble breathing. These symptoms may be an emergency. Do not wait to see if the symptoms will go away. Get medical help right away. Call your local emergency services (911 in the [place] Do not drive yourself to the hospital. Summary  Dehydration is a condition in which there is not enough water or other fluids in the body. This happens when a person loses more fluids than he or she takes in.  Treatment for this condition depends on how bad it is. Treatment should be started right away. Do not wait until your condition gets very bad.  Drink enough clear fluid to keep your pee pale yellow. If you were told to drink an oral rehydration solution (ORS), finish the ORS first. Then, start slowly drinking other clear fluids.  Take over-the-counter and prescription medicines only as told by your doctor.  Get help right away if you  have any symptoms of very bad dehydration. This information is not intended to replace advice given to you by your health care provider. Make sure you discuss any questions you have with your health care provider. Document Revised: 11/20/2018 Document Reviewed: 11/20/2018 Elsevier Patient Education  2020 [employer].

## 2020-04-19 ENCOUNTER — Other Ambulatory Visit: Payer: Self-pay | Admitting: Radiology

## 2020-04-19 ENCOUNTER — Encounter: Payer: Self-pay | Admitting: Emergency Medicine

## 2020-04-19 ENCOUNTER — Encounter (HOSPITAL_COMMUNITY): Payer: Self-pay | Admitting: Internal Medicine

## 2020-04-19 ENCOUNTER — Inpatient Hospital Stay (HOSPITAL_COMMUNITY): Payer: Medicare Other

## 2020-04-19 ENCOUNTER — Inpatient Hospital Stay (HOSPITAL_COMMUNITY): Payer: Medicare Other | Admitting: Certified Registered"

## 2020-04-19 ENCOUNTER — Encounter (HOSPITAL_COMMUNITY)
Admission: EM | Disposition: A | Discharge status: Home Health | Payer: Self-pay | Source: Home / Self Care | Attending: Internal Medicine

## 2020-04-19 DIAGNOSIS — I361 Nonrheumatic tricuspid (valve) insufficiency: Secondary | ICD-10-CM

## 2020-04-19 DIAGNOSIS — I350 Nonrheumatic aortic (valve) stenosis: Secondary | ICD-10-CM

## 2020-04-19 DIAGNOSIS — I4891 Unspecified atrial fibrillation: Secondary | ICD-10-CM

## 2020-04-19 DIAGNOSIS — E041 Nontoxic single thyroid nodule: Secondary | ICD-10-CM

## 2020-04-19 HISTORY — PX: ESOPHAGOGASTRODUODENOSCOPY: SHX5428

## 2020-04-19 HISTORY — PX: BIOPSY: SHX5522

## 2020-04-19 LAB — PTH, INTACT AND CALCIUM
Calcium, Total (PTH): 10.5 mg/dL — ABNORMAL HIGH (ref 8.7–10.3)
PTH: 26 pg/mL (ref 15–65)

## 2020-04-19 LAB — COMPREHENSIVE METABOLIC PANEL
ALT: 16 U/L (ref 0–44)
AST: 17 U/L (ref 15–41)
Albumin: 2.4 g/dL — ABNORMAL LOW (ref 3.5–5.0)
Alkaline Phosphatase: 118 U/L (ref 38–126)
Anion gap: 9 (ref 5–15)
BUN: 24 mg/dL — ABNORMAL HIGH (ref 8–23)
CO2: 25 mmol/L (ref 22–32)
Calcium: 10.4 mg/dL — ABNORMAL HIGH (ref 8.9–10.3)
Chloride: 105 mmol/L (ref 98–111)
Creatinine, Ser: 0.64 mg/dL (ref 0.44–1.00)
GFR, Estimated: >60 mL/min (ref >60)
Glucose, Bld: 103 mg/dL — ABNORMAL HIGH (ref 70–99)
Potassium: 3.9 mmol/L (ref 3.5–5.1)
Sodium: 139 mmol/L (ref 135–145)
Total Bilirubin: 0.4 mg/dL (ref 0.3–1.2)
Total Protein: 6.2 g/dL — ABNORMAL LOW (ref 6.5–8.1)

## 2020-04-19 LAB — GLUCOSE, CAPILLARY
Glucose-Capillary: 79 mg/dL (ref 70–99)
Glucose-Capillary: 89 mg/dL (ref 70–99)
Glucose-Capillary: 116 mg/dL — ABNORMAL HIGH (ref 70–99)
Glucose-Capillary: 118 mg/dL — ABNORMAL HIGH (ref 70–99)

## 2020-04-19 LAB — ECHOCARDIOGRAM COMPLETE
AR max vel: 1.54 cm2
AV Area VTI: 1.78 cm2
AV Area mean vel: 1.71 cm2
AV Mean grad: 18.5 mmHg
AV Peak grad: 34.3 mmHg
Ao pk vel: 2.93 m/s
Area-P 1/2: 2.62 cm2
Height: 60 in
S' Lateral: 2.7 cm
Weight: 2793.67 oz

## 2020-04-19 LAB — CALCITRIOL (1,25 DI-OH VIT D): Vit D, 1,25-Dihydroxy: 23.2 pg/mL (ref 19.9–79.3)

## 2020-04-19 LAB — CBC
HCT: 31.2 % — ABNORMAL LOW (ref 36.0–46.0)
Hemoglobin: 10 g/dL — ABNORMAL LOW (ref 12.0–15.0)
MCH: 28.7 pg (ref 26.0–34.0)
MCHC: 32.1 g/dL (ref 30.0–36.0)
MCV: 89.7 fL (ref 80.0–100.0)
Platelets: 514 10*3/uL — ABNORMAL HIGH (ref 150–400)
RBC: 3.48 MIL/uL — ABNORMAL LOW (ref 3.87–5.11)
RDW: 13.9 % (ref 11.5–15.5)
WBC: 14.9 10*3/uL — ABNORMAL HIGH (ref 4.0–10.5)
nRBC: 0 % (ref 0.0–0.2)

## 2020-04-19 LAB — SARS CORONAVIRUS 2 (TAT 6-24 HRS): SARS Coronavirus 2: NEGATIVE

## 2020-04-19 LAB — CALCIUM, IONIZED: Calcium, Ionized, Serum: 6.4 mg/dL — ABNORMAL HIGH (ref 4.5–5.6)

## 2020-04-19 SURGERY — EGD (ESOPHAGOGASTRODUODENOSCOPY)
Anesthesia: Monitor Anesthesia Care

## 2020-04-19 MED ORDER — SODIUM CHLORIDE (PF) 0.9 % IJ SOLN
INTRAMUSCULAR | Status: AC
  Filled 2020-04-19: qty 10

## 2020-04-19 MED ORDER — PROPOFOL 500 MG/50ML IV EMUL
INTRAVENOUS | Status: DC | PRN
  Administered 2020-04-19: 100 ug/kg/min via INTRAVENOUS

## 2020-04-19 MED ORDER — MELATONIN 3 MG PO TABS
6.0000 mg | ORAL_TABLET | Freq: Every evening | ORAL | Status: DC | PRN
  Administered 2020-04-19: 23:00:00 6 mg via ORAL
  Filled 2020-04-19: qty 2

## 2020-04-19 MED ORDER — PROPOFOL 10 MG/ML IV BOLUS
INTRAVENOUS | Status: DC | PRN
  Administered 2020-04-19 (×3): 10 mg via INTRAVENOUS

## 2020-04-19 MED ORDER — PROPOFOL 500 MG/50ML IV EMUL
INTRAVENOUS | Status: AC
  Filled 2020-04-19: qty 50

## 2020-04-19 NOTE — Progress Notes (Signed)
Initial Nutrition Assessment  DOCUMENTATION CODES:   Obesity unspecified  INTERVENTION:  Will provide oral nutrition supplements as appropriate with diet advancement   NUTRITION DIAGNOSIS:   Inadequate oral intake related to decreased appetite as evidenced by per patient/family report.    GOAL:   Patient will meet greater than or equal to 90% of their needs    MONITOR:   Labs,I & O's,Diet advancement,Skin,Supplement acceptance,PO intake,Weight trends  REASON FOR ASSESSMENT:   Malnutrition Screening Tool    ASSESSMENT:   86 year old female admitted with hypercalcemia and leukocytosis presents from primary care office due. Past medical history significant of mild dementia, HTN, HLD, chronic dCHF, diet controlled DM, very HOH, and recent TIA in October 2021.  RD working remotely.  RD attempted to contact pt via phone this morning, however no answer. Unable to obtain nutrition history at this time. Per notes patient ambulates around the house, requires some assistance with ADL's, typically alert and oriented x 3 at baseline, but is forgetful and needs frequent reminders regarding time. Since stroke patient has slowly declined, PCP notes at recent office visit pt complains of decreased appetite, weight loss, weakness, BLE edema, as well as some substernal chest pain when swallowing food.  Per chart weights have trended down 9.46 lbs (5.1%) in the last 2 months which is insignificant for time frame, however concerning given advanced age, poor appetite, as well as significant past medical history of chronic dCHF. Will provide nutrition supplements with diet advancement as appropriate and will plan to obtain history as well as perform exam at follow-up.   Medications reviewed and include: SSI, Protonix  IVF: NaCl @ 100 ml/hr  Labs: CBG 79, BUN 24 (H), Ca 10.4 (H) trending down, WBC 14.9 (H), Hgb 10 (L), HCT 31.2 (L) A1c 6.2 on 12/27 (well controlled)  Per notes: -moderate  sized hiatal hernia -irregularity of distal esophagus concerning for esophagitis vs neoplasm -right thyroid mass -UA negative; check blood cultures -GI consult for possible EGD -new onset A fib, rate controlled  NUTRITION - FOCUSED PHYSICAL EXAM:  Unable to complete at this time  Diet Order:   Diet Order            Diet NPO time specified  Diet effective now                 EDUCATION NEEDS:   No education needs have been identified at this time  Skin:  Skin Assessment: Reviewed RN Assessment  Last BM:  unknown  Height:   Ht Readings from Last 1 Encounters:  04/18/20 5' (1.524 m)    Weight:   Wt Readings from Last 1 Encounters:  04/19/20 79.2 kg    BMI:  Body mass index is 34.1 kg/m.  Estimated Nutritional Needs:   Kcal:  1600-1800  Protein:  87-95  Fluid:  >/= 1.6 L   Lars Masson, RD, LDN Clinical Nutrition After Hours/Weekend Pager # in Amion

## 2020-04-19 NOTE — Progress Notes (Signed)
PT Cancellation Note  Patient Details Name: Anne Rice MRN: [id number] DOB: [birth year]   Cancelled Treatment:    Reason Eval/Treat Not Completed: Patient at procedure or test/unavailable   [facility] 04/19/2020, 11:32 AM

## 2020-04-19 NOTE — ED Notes (Signed)
Attempted to call floor to give report, no answer

## 2020-04-19 NOTE — Anesthesia Preprocedure Evaluation (Addendum)
Anesthesia Evaluation  Patient identified by MRN, date of birth, ID band  Reviewed: Allergy & Precautions, NPO status , Patient's Chart, lab work & pertinent test results  Airway Mallampati: II  TM Distance: >3 FB Neck ROM: Limited    Dental no notable dental hx.    Pulmonary former smoker,  Quit smoking 1969   Pulmonary exam normal breath sounds clear to auscultation       Cardiovascular hypertension, Pt. on medications +CHF (grade 1 diastolic dysfunction)   Rhythm:Regular Rate:Normal  Last echo 2017: - Left ventricle: The cavity size was normal. Systolic function was  normal. The estimated ejection fraction was in the range of 60%  to 65%. Wall motion was normal; there were no regional wall  motion abnormalities. Doppler parameters are consistent with  abnormal left ventricular relaxation (grade 1 diastolic  dysfunction).  - Aortic valve: There was trivial regurgitation.  - Left atrium: The atrium was mildly dilated.    Neuro/Psych PSYCHIATRIC DISORDERS Depression Dementia TIA (01/2020)   GI/Hepatic Neg liver ROS, Abnormal esophageal CT- slowly declined since TIA in 01/2020- c/o some difficulty swallowing, some CP when swallowing CT chest abdomen pelvis was completed which revealed moderate sized hiatal hernia as well as irregularity of the distal portion of the esophagus concerning for possible esophagitis versus neoplasm as well as right thyroid mass   Endo/Other  diabetes (diet controlled), Well ControlledObesity BMI 34  Renal/GU negative Renal ROS  negative genitourinary   Musculoskeletal negative musculoskeletal ROS (+)   Abdominal   Peds  Hematology negative hematology ROS (+)   Anesthesia Other Findings   Reproductive/Obstetrics negative OB ROS                           Anesthesia Physical Anesthesia Plan  ASA: III  Anesthesia Plan: MAC   Post-op Pain Management:     Induction:   PONV Risk Score and Plan: 2 and Propofol infusion and TIVA  Airway Management Planned: Natural Airway and Simple Face Mask  Additional Equipment: None  Intra-op Plan:   Post-operative Plan:   Informed Consent: I have reviewed the patients History and Physical, chart, labs and discussed the procedure including the risks, benefits and alternatives for the proposed anesthesia with the patient or authorized representative who has indicated his/her understanding and acceptance.   Patient has DNR.  Discussed DNR with power of attorney and Continue DNR.   Consent reviewed with POA  Plan Discussed with: Anesthesiologist and CRNA  Anesthesia Plan Comments: (Thorough discussion with family and son Fransisco Beau. Decision made by [name] to continue the DNR as written meaning no intubation, chest compressions or defibrillation. OK to received medications. Norton Blizzard, MD  )       Anesthesia Quick Evaluation

## 2020-04-19 NOTE — Op Note (Signed)
[facility] Patient Name: Anne Rice Procedure Date: 04/19/2020 MRN: [id number] Attending MD: Ronald Lobo , MD Date of Birth: [birth year] CSN: [id number] Age: 86 Admit Type: Inpatient Procedure:                Upper GI endoscopy Indications:              Epigastric abdominal pain, Abnormal CT of the GI                            tract, Weight loss Providers:                Ronald Lobo, MD, Benay Pillow, RN, Nelia Shi, RN, Tyna Jaksch Technician Referring MD:              Medicines:                Monitored Anesthesia Care Complications:            No immediate complications. Estimated Blood Loss:     Estimated blood loss was minimal. Procedure:                Pre-Anesthesia Assessment:                           - Prior to the procedure, a History and Physical                            was performed, and patient medications and                            allergies were reviewed. The patient's tolerance of                            previous anesthesia was also reviewed. The risks                            and benefits of the procedure and the sedation                            options and risks were discussed with the patient.                            All questions were answered, and informed consent                            was obtained. Prior Anticoagulants: The patient has                            taken no previous anticoagulant or antiplatelet                            agents except for aspirin. ASA Grade Assessment:  III - A patient with severe systemic disease. After                            reviewing the risks and benefits, the patient was                            deemed in satisfactory condition to undergo the                            procedure.                           After obtaining informed consent, the endoscope was                            passed under direct  vision. Throughout the                            procedure, the patient's blood pressure, pulse, and                            oxygen saturations were monitored continuously. The                            [id number]-H190 ([id number]) Olympus gastroscope was                            introduced through the mouth, and advanced to the                            second part of duodenum. The upper GI endoscopy was                            accomplished without difficulty. The patient                            tolerated the procedure well. Scope In: Scope Out: Findings:      Severe esophagitis with no bleeding was found beginning at about 20cm       from the incisors, but becoming EXTREMELY severe with severe edema and       deep, large areas of ulceration for the distal half of the esophagus.       Athough there was not an obvious discrete mass, I cannot exclude       extensive tumor with malignant ulceration. Multiple biopsies were taken       with a cold forceps for histology.      A 7 cm hiatal hernia was present.      There is no endoscopic evidence of stricture in the entire esophagus.      The entire examined stomach was normal.      The cardia and gastric fundus were normal on retroflexion.      The examined duodenum was normal. Impression:               - Severe esophagitis with extensive deep ulceration  but no bleeding, malignancy not excluded. Biopsied.                           - 7 cm hiatal hernia.                           - Normal stomach.                           - Normal examined duodenum. Moderate Sedation:      This patient was sedated with monitored anesthesia care, not moderate       sedation. Recommendation:           - Await pathology results.                           - Continue present medications. Procedure Code(s):        --- Professional ---                           [phone], Esophagogastroduodenoscopy, flexible,                             transoral; with biopsy, single or multiple Diagnosis Code(s):        --- Professional ---                           K20.90, Esophagitis, unspecified without bleeding                           K44.9, Diaphragmatic hernia without obstruction or                            gangrene                           R10.13, Epigastric pain                           R63.4, Abnormal weight loss                           R93.3, Abnormal findings on diagnostic imaging of                            other parts of digestive tract CPT copyright 2019 [employer] Association. All rights reserved. The codes documented in this report are preliminary and upon coder review may  be revised to meet current compliance requirements. Ronald Lobo, MD 04/19/2020 11:32:39 AM This report has been signed electronically. Number of Addenda: 0

## 2020-04-19 NOTE — Consult Note (Signed)
Referring Provider: Dr. Maylene Roes Primary Care Physician:  Patient, No Pcp Per Primary Gastroenterologist: Althia Forts  Reason for Consultation: Abnormal CT of esophagus  HPI: Anne Rice is a 86 y.o. female with past medical history pertinent for dementia presenting for consultation of abnormal CT of the esophagus.  Patient denies any complaints today.  Denies dysphagia, odynophagia, abdominal pain, nausea.  Per patient's son, patient has had decreased appetite, weight loss, and weakness since October of this year when she suffered a TIA.    No prior EGD or colonoscopy on file.  No documented BMs since admission.  Patient takes 81 mg ASA but is not on any blood thinners, no known NSAID use.  Past Medical History:  Diagnosis Date  . Depression   . Murmur 12/06/2015    Past Surgical History:  Procedure Laterality Date  . ABDOMINAL HYSTERECTOMY    . APPENDECTOMY      Prior to Admission medications   Medication Sig Start Date End Date Taking? Authorizing Provider  acetaminophen (TYLENOL) 500 MG tablet Take 1,000 mg by mouth every 6 (six) hours as needed for mild pain, fever or headache.   Yes [provider]  amLODipine (NORVASC) 2.5 MG tablet TAKE 1 TABLET BY MOUTH EVERY DAY Patient taking differently: Take 2.5 mg by mouth daily. 01/18/20  Yes Jacelyn Pi, Lilia Argue, MD  aspirin EC 81 MG tablet Take 81 mg by mouth daily.    Yes [provider]  diclofenac (VOLTAREN) 50 MG EC tablet TAKE 1 TABLET BY MOUTH EVERYDAY AS NEEDED FOR MODERATE PAIN. Patient taking differently: Take 50 mg by mouth daily. Take 1 tablet by mouth everyday as needed for moderate pain. 11/27/19  Yes Jacelyn Pi, Lilia Argue, MD  Multiple Vitamin (MULTIVITAMIN WITH MINERALS) TABS tablet Take 1 tablet by mouth daily.   Yes [provider]    Scheduled Meds: . enoxaparin (LOVENOX) injection  40 mg Subcutaneous Q24H  . insulin aspart  0-9 Units Subcutaneous TID WC  . pantoprazole (PROTONIX)  IV  40 mg Intravenous Q12H   Continuous Infusions: . sodium chloride 100 mL/hr at 04/19/20 0328   PRN Meds:.acetaminophen **OR** acetaminophen, metoprolol tartrate, ondansetron **OR** ondansetron (ZOFRAN) IV  Allergies as of 04/18/2020  . (No Known Allergies)    Family History  Problem Relation Age of Onset  . Parkinson's disease Father   . Dementia Brother     Social History   Socioeconomic History  . Marital status: Unknown    Spouse name: Not on file  . Number of children: Not on file  . Years of education: Not on file  . Highest education level: Not on file  Occupational History  . Not on file  Tobacco Use  . Smoking status: Former Smoker    Quit date: 04/24/1967    Years since quitting: 53.0  . Smokeless tobacco: Never Used  Substance and Sexual Activity  . Alcohol use: No  . Drug use: No  . Sexual activity: Never  Other Topics Concern  . Not on file  Social History Narrative  . Not on file   Social Determinants of Health   Financial Resource Strain: Not on file  Food Insecurity: Not on file  Transportation Needs: Not on file  Physical Activity: Not on file  Stress: Not on file  Social Connections: Not on file  Intimate Partner Violence: Not on file    Review of Systems: Review of Systems  Unable to perform ROS: Dementia   Physical Exam: Vital signs: Vitals:  04/19/20 0344 04/19/20 0756  BP: 133/73 (!) 143/54  Pulse: 82 70  Resp: 16 20  Temp: 97.7 F (36.5 C) 97.7 F (36.5 C)  SpO2: 98% 99%     Physical Exam Vitals reviewed.  Constitutional:      General: She is not in acute distress. HENT:     Head: Normocephalic and atraumatic.     Nose: Nose normal. No congestion.     Mouth/Throat:     Mouth: Mucous membranes are moist.     Pharynx: Oropharynx is clear.  Eyes:     General: No scleral icterus.    Extraocular Movements: Extraocular movements intact.     Conjunctiva/sclera: Conjunctivae normal.  Cardiovascular:     Rate and  Rhythm: Normal rate and regular rhythm.     Pulses: Normal pulses.     Heart sounds: Murmur heard.    Pulmonary:     Effort: Pulmonary effort is normal.     Breath sounds: Normal breath sounds.  Abdominal:     General: Bowel sounds are normal. There is no distension.     Palpations: Abdomen is soft. There is no mass.     Tenderness: There is abdominal tenderness (epigastric). There is no guarding or rebound.     Hernia: No hernia is present.  Musculoskeletal:        General: No swelling or tenderness.     Cervical back: Normal range of motion and neck supple.  Skin:    General: Skin is warm and dry.  Neurological:     General: No focal deficit present.     Mental Status: Mental status is at baseline. She is lethargic and disoriented.  Psychiatric:        Mood and Affect: Mood normal.        Behavior: Behavior normal. Behavior is cooperative.      GI:  Lab Results: Recent Labs    04/18/20 1020 04/18/20 1300 04/19/20 0615  WBC 20.3* 21.0* 14.9*  HGB 11.8 11.9* 10.0*  HCT 35.7 37.3 31.2*  PLT  --  528* 514*   BMET Recent Labs    04/18/20 1300 04/18/20 1618 04/19/20 0615  NA 135  --  139  K 4.4  --  3.9  CL 98  --  105  CO2 27  --  25  GLUCOSE 117*  --  103*  BUN 26*  --  24*  CREATININE 0.72  --  0.64  CALCIUM 11.5* 10.5* 10.4*   LFT Recent Labs    04/19/20 0615  PROT 6.2*  ALBUMIN 2.4*  AST 17  ALT 16  ALKPHOS 118  BILITOT 0.4   PT/INR No results for input(s): LABPROT, INR in the last 72 hours.   Studies/Results: CT Chest W Contrast  Result Date: 04/18/2020 CLINICAL DATA:  Weight loss, back and flank pain. EXAM: CT CHEST, ABDOMEN, AND PELVIS WITH CONTRAST TECHNIQUE: Multidetector CT imaging of the chest, abdomen and pelvis was performed following the standard protocol during bolus administration of intravenous contrast. CONTRAST:  OMNIPAQUE IOHEXOL 300 MG/ML  SOLN COMPARISON:  None. FINDINGS: CT CHEST FINDINGS Cardiovascular:  Atherosclerosis of thoracic aorta is noted without aneurysm or dissection. Mild cardiomegaly is noted. No pericardial effusion is noted. Coronary artery calcifications are noted. Mediastinum/Nodes: 4.4 cm right thyroid mass is noted. No adenopathy is noted. The proximal esophagus is dilated. Moderate size sliding-type hiatal hernia is noted. It is also thickened and irregular in appearance and its distal portion concerning for possible esophagitis or neoplasm.  Lungs/Pleura: No pneumothorax or pleural effusion is noted. Calcified granuloma is noted in right middle lobe. Minimal bibasilar subsegmental atelectasis is noted. Musculoskeletal: No chest wall mass or suspicious bone lesions identified. CT ABDOMEN PELVIS FINDINGS Hepatobiliary: No focal liver abnormality is seen. No gallstones, gallbladder wall thickening, or biliary dilatation. Pancreas: Unremarkable. No pancreatic ductal dilatation or surrounding inflammatory changes. Spleen: Calcified splenic granulomata are noted. Adrenals/Urinary Tract: Adrenal glands are unremarkable. Kidneys are normal, without renal calculi, focal lesion, or hydronephrosis. Bladder is unremarkable. Stomach/Bowel: There is no evidence of bowel obstruction or inflammation. Status post appendectomy. Vascular/Lymphatic: Aortic atherosclerosis. No enlarged abdominal or pelvic lymph nodes. Reproductive: Status post hysterectomy. No adnexal masses. Other: No abdominal wall hernia or abnormality. No abdominopelvic ascites. Musculoskeletal: No acute or significant osseous findings. IMPRESSION: 1. Moderate size sliding-type hiatal hernia is noted. The esophagus is also thickened and irregular in appearance in its distal portion concerning for possible esophagitis or neoplasm. Endoscopy is recommended for further evaluation. 2. 4.4 cm right thyroid mass is noted. Recommend thyroid US. (Ref: J Am Coll Radiol. 2015 Feb;12(2): 143-50). 3. Coronary artery calcifications are noted suggesting  coronary artery disease. 4. No other significant abnormality seen in the chest, abdomen or pelvis. 5. Aortic atherosclerosis. Aortic Atherosclerosis (ICD10-I70.0). Electronically Signed   By: Marijo Conception M.D.   On: 04/18/2020 14:48   CT Abdomen Pelvis W Contrast  Result Date: 04/18/2020 CLINICAL DATA:  Weight loss, back and flank pain. EXAM: CT CHEST, ABDOMEN, AND PELVIS WITH CONTRAST TECHNIQUE: Multidetector CT imaging of the chest, abdomen and pelvis was performed following the standard protocol during bolus administration of intravenous contrast. CONTRAST:  178mL OMNIPAQUE IOHEXOL 300 MG/ML  SOLN COMPARISON:  None. FINDINGS: CT CHEST FINDINGS Cardiovascular: Atherosclerosis of thoracic aorta is noted without aneurysm or dissection. Mild cardiomegaly is noted. No pericardial effusion is noted. Coronary artery calcifications are noted. Mediastinum/Nodes: 4.4 cm right thyroid mass is noted. No adenopathy is noted. The proximal esophagus is dilated. Moderate size sliding-type hiatal hernia is noted. It is also thickened and irregular in appearance and its distal portion concerning for possible esophagitis or neoplasm. Lungs/Pleura: No pneumothorax or pleural effusion is noted. Calcified granuloma is noted in right middle lobe. Minimal bibasilar subsegmental atelectasis is noted. Musculoskeletal: No chest wall mass or suspicious bone lesions identified. CT ABDOMEN PELVIS FINDINGS Hepatobiliary: No focal liver abnormality is seen. No gallstones, gallbladder wall thickening, or biliary dilatation. Pancreas: Unremarkable. No pancreatic ductal dilatation or surrounding inflammatory changes. Spleen: Calcified splenic granulomata are noted. Adrenals/Urinary Tract: Adrenal glands are unremarkable. Kidneys are normal, without renal calculi, focal lesion, or hydronephrosis. Bladder is unremarkable. Stomach/Bowel: There is no evidence of bowel obstruction or inflammation. Status post appendectomy. Vascular/Lymphatic:  Aortic atherosclerosis. No enlarged abdominal or pelvic lymph nodes. Reproductive: Status post hysterectomy. No adnexal masses. Other: No abdominal wall hernia or abnormality. No abdominopelvic ascites. Musculoskeletal: No acute or significant osseous findings. IMPRESSION: 1. Moderate size sliding-type hiatal hernia is noted. The esophagus is also thickened and irregular in appearance in its distal portion concerning for possible esophagitis or neoplasm. Endoscopy is recommended for further evaluation. 2. 4.4 cm right thyroid mass is noted. Recommend thyroid US. (Ref: J Am Coll Radiol. 2015 Feb;12(2): 143-50). 3. Coronary artery calcifications are noted suggesting coronary artery disease. 4. No other significant abnormality seen in the chest, abdomen or pelvis. 5. Aortic atherosclerosis. Aortic Atherosclerosis (ICD10-I70.0). Electronically Signed   By: Marijo Conception M.D.   On: 04/18/2020 14:48   US THYROID  Result Date:  04/19/2020 CLINICAL DATA:  Incidental on CT. Right-sided thyroid nodule incidentally noted on chest CT performed 04/18/2020 EXAM: THYROID ULTRASOUND TECHNIQUE: Ultrasound examination of the thyroid gland and adjacent soft tissues was performed. COMPARISON:  Chest CT-04/18/2020 FINDINGS: Parenchymal Echotexture: Mildly heterogenous Isthmus: Normal in size measuring 0.6 cm in diameter Right lobe: Enlarged measures 6.8 x 3.7 x 3.5 cm Left lobe: Atrophic measuring 2.9 x 1.5 x 1.2 cm _________________________________________________________ Estimated total number of nodules >/= 1 cm: 1 Number of spongiform nodules >/=  2 cm not described below (TR1): 0 Number of mixed cystic and solid nodules >/= 1.5 cm not described below ([place]): 0 _________________________________________________________ Nodule # 1: Location: Right; Mid Maximum size: 5.8 cm; Other 2 dimensions: 4.1 x 3.9 cm Composition: solid/almost completely solid (2) Echogenicity: hyperechoic (1) Shape: taller-than-wide (3) Margins: ill-defined  (0) Echogenic foci: none (0) ACR TI-RADS total points: 6. ACR TI-RADS risk category: TR4 (4-6 points). ACR TI-RADS recommendations: **Given size (>/= 1.5 cm) and appearance, fine needle aspiration of this moderately suspicious nodule should be considered based on TI-RADS criteria. _________________________________________________________ IMPRESSION: Solitary nodule/mass, nearly replacing the right lobe of the thyroid, correlating with the nodule seen on preceding chest CT, meets imaging criteria to recommend percutaneous sampling as indicated. The above is in keeping with the ACR TI-RADS recommendations - J Am Coll Radiol 2017;14:587-595. Electronically Signed   By: Sandi Mariscal M.D.   On: 04/19/2020 07:38    Impression: Abnormal CT of esophagus: esophagitis vs. mass -CT 04/18/20: Moderate size sliding-type hiatal hernia is noted. The esophagus is also thickened and irregular in appearance in its distal portion concerning for possible esophagitis or neoplasm.  -Hgb 10.0, decreased from 11.9 yesterday, most likely dilutional, no signs of bleeding  Dementia, patient's son Anne Rice is medical decision-maker  Diet-controlled DM type 2  Plan: EGD today with Dr. Cristina Gong.  I discussed options with patient's son to include observation/PPI vs. EGD.  Patient's son prefers EGD to determine whether or not patient has a cancerous mass so he can make decisions on palliation.  I thoroughly discussed EGD with son, Anne Rice, to include nature, alternatives (PPI/observation), benefits (possible diagnosis), and risks (including but not limited to bleeding, infection, perforation, anesthesia/cardiac and pulmonary complications). Patient's son Anne Rice verbalized understanding and gave verbal consent to proceed with EGD.  Keep patient NPO.  It was noted that a diet order was active, but I discussed with RN Aldona Bar who states patient has not had any food/liquid since midnight.  Continue Protonix 40 mg IV  BID.   LOS: 1 day   Salley Slaughter  PA-C 04/19/2020, 9:13 AM  Contact #  [phone]

## 2020-04-19 NOTE — Transfer of Care (Signed)
Immediate Anesthesia Transfer of Care Note  Patient: Anne Rice  Procedure(s) Performed: ESOPHAGOGASTRODUODENOSCOPY (EGD) (N/A ) BIOPSY  Patient Location: PACU  Anesthesia Type:MAC  Level of Consciousness: awake  Airway & Oxygen Therapy: Patient Spontanous Breathing and Patient connected to face mask oxygen  Post-op Assessment: Report given to RN, Post -op Vital signs reviewed and stable and Patient moving all extremities X 4  Post vital signs: Reviewed and stable  Last Vitals:  Vitals Value Taken Time  BP    Temp    Pulse    Resp    SpO2      Last Pain:  Vitals:   04/19/20 0954  TempSrc: Axillary  PainSc:          Complications: No complications documented.

## 2020-04-19 NOTE — Progress Notes (Signed)
  Echocardiogram 2D Echocardiogram has been performed.  Anne Rice 04/19/2020, 1:47 PM

## 2020-04-19 NOTE — Progress Notes (Signed)
Patient's endoscopy was well-tolerated, and shows extremely severe esophagitis with deep ulcerations but no severe stricturing or bleeding.  To me, this looks like severe benign inflammation rather than malignancy, but hopefully biopsies will be ready tomorrow.  The endoscopic findings were discussed with the patient's son, Anne Rice, by telephone, at considerable length.  Recommendations:  1.  Continue high-dose PPI 2.  Await pathology results 3.  Follow oral administration of medications with plenty of fluid 4.  Pharmacy consultation (ordered) to see which of the patient's medications could be converted to liquid form, or crushed, to optimize transit through this patient's esophagus, which we can assume has very poor motility in the presence of such severe inflammation and ulceration.  Anne Rice, M.D. Pager [phone] If no answer or after 5 PM call [phone]

## 2020-04-19 NOTE — Evaluation (Signed)
Clinical/Bedside Swallow Evaluation Patient Details  Name: Anne Rice MRN: [id number] Date of Birth: 1924/11/29  Today's Date: 04/19/2020 Time: SLP Start Time (ACUTE ONLY): 1555 SLP Stop Time (ACUTE ONLY): 1615 SLP Time Calculation (min) (ACUTE ONLY): 20 min  Past Medical History:  Past Medical History:  Diagnosis Date  . Depression   . Murmur 12/06/2015   Past Surgical History:  Past Surgical History:  Procedure Laterality Date  . ABDOMINAL HYSTERECTOMY    . APPENDECTOMY     HPI:  Patient is a 86 year old female PMH  of HTN, diet-controlled diabetes, mild dementia who presents to the hospital from PCP due to abnormal labs including leukocytosis and hypercalcemia. Son reports pt suffered a TIA 01/2020 and since that time, patient has slowly declined, appetite has decreased and patient has gotten gradually weaker. EGD completed on 04/19/20 revealed "extremely severe esophagitis with deep ulcerations but no severe stricturing or bleeding".   Assessment / Plan / Recommendation Clinical Impression  Patient presents with an oropharyngeal swallow that is [facility] but suspect primary esophageal dysphagia. EGD completed today revealed "extremely severe esophagitis with deep ulcerations but no severe stricturing or bleeding" and patient was placed on a soft diet per GI with recommendation for medications in liquid suspension or crushed due to patient's likely dysmotility. Patient did not exhibit any overt s/s of oropharyngeal dysphaga (no coughing or throat clearing, no changes in voice or respirations,etc.) Patient's son did report that her appetite has declined recently but she will eat try things and if she likes the taste, she does well with it. Patient may benefit from at least one session focused on family education for esophageal dysphagia management. SLP Visit Diagnosis: Dysphagia, pharyngoesophageal phase (R13.14)    Aspiration Risk  Mild aspiration risk;No limitations    Diet  Recommendation Regular;Thin liquid   Liquid Administration via: Cup;Straw Medication Administration: Other (Comment) (GI is recommending liquid suspension or crushed) Compensations: Minimize environmental distractions;Slow rate;Small sips/bites Postural Changes: Seated upright at 90 degrees;Remain upright for at least 30 minutes after po intake    Other  Recommendations Oral Care Recommendations: Oral care BID   Follow up Recommendations None      Frequency and Duration min 1 x/week  1 week       Prognosis   Good     Swallow Study   General Date of Onset: 04/18/20 HPI: Patient is a 86 year old female PMH  of HTN, diet-controlled diabetes, mild dementia who presents to the hospital from PCP due to abnormal labs including leukocytosis and hypercalcemia. Son reports pt suffered a TIA 01/2020 and since that time, patient has slowly declined, appetite has decreased and patient has gotten gradually weaker. EGD completed on 04/19/20 revealed "extremely severe esophagitis with deep ulcerations but no severe stricturing or bleeding". Type of Study: Bedside Swallow Evaluation Previous Swallow Assessment: EGD today, see report in chart Diet Prior to this Study: Thin liquids;Regular;Other (Comment) (soft diet) Temperature Spikes Noted: No Respiratory Status: Room air History of Recent Intubation: No Behavior/Cognition: Alert;Cooperative;Pleasant mood;Confused Oral Cavity Assessment: Within Functional Limits Oral Care Completed by SLP: Recent completion by staff Oral Cavity - Dentition: Adequate natural dentition Vision: Functional for self-feeding Self-Feeding Abilities: Able to feed self;Needs set up Patient Positioning: Upright in bed Baseline Vocal Quality: Normal Volitional Cough: Strong Volitional Swallow: Unable to elicit    Oral/Motor/Sensory Function Overall Oral Motor/Sensory Function: Within functional limits   Ice Chips Ice chips: Not tested   Thin Liquid Thin Liquid: Within  functional limits  Presentation: Self Fed;Straw    Nectar Thick     Honey Thick     Puree     Solid     Solid: Within functional limits Presentation: Self Fed Other Comments: Patient consumed gelatin without overt s/s difficulty      Angela Nevin, MA, CCC-SLP Speech Therapy

## 2020-04-19 NOTE — Evaluation (Signed)
Occupational Therapy Evaluation Patient Details Name: Anne Rice MRN: [id number] DOB: October 08, 1924 Today's Date: 04/19/2020    History of Present Illness Patient is a 86 year old female PMH  of HTN, diet-controlled diabetes, mild dementia who presents to the hospital from PCP due to abnormal labs including leukocytosis and hypercalcemia. Son reports pt suffered a TIA 01/2020 and since that time, patient has slowly declined, appetite has decreased and patient has gotten gradually weaker.   Clinical Impression   Patient is very pleasant however confused, oriented to self only therefore unable to obtain PLOF. Per chart patient lives with her son and is ambulatory at home. Currently patient hand held min A x1 for stand pivot to recliner with mild unsteadiness, setup/supervision for UB ADL and min A LB ADL. Due to hx of dementia feel patient would benefit from being in home environment as long as son can provide current assists levels/supervision. Recommend continued acute OT services to maximize patient activity tolerance and safety in order to reduce caregiver burden.     Follow Up Recommendations  Supervision/Assistance - 24 hour;No OT follow up    Equipment Recommendations  Tub/shower seat;Other (comment) (if does not have/unsure home DME)       Precautions / Restrictions Precautions Precautions: Fall Restrictions Weight Bearing Restrictions: No      Mobility Bed Mobility Overal bed mobility: Needs Assistance Bed Mobility: Supine to Sit     Supine to sit: Min assist;HOB elevated     General bed mobility comments: min A to upright trunk    Transfers Overall transfer level: Needs assistance Equipment used: 1 person hand held assist Transfers: Sit to/from [employer] Sit to Stand: Min guard Stand pivot transfers: Min assist       General transfer comment: please see toilet transfer in ADL section    Balance Overall balance assessment: Mild deficits  observed, not formally tested                                         ADL either performed or assessed with clinical judgement   ADL Overall ADL's : Needs assistance/impaired Eating/Feeding: Independent;Sitting   Grooming: Wash/dry face;Wash/dry hands;Set up;Sitting   Upper Body Bathing: Supervision/ safety;Set up;Sitting   Lower Body Bathing: Minimal assistance;Sit to/from stand;Sitting/lateral leans   Upper Body Dressing : Supervision/safety;Set up;Sitting;Standing   Lower Body Dressing: Minimal assistance;Sit to/from stand   Toilet Transfer: Minimal assistance Toilet Transfer Details (indicate cue type and reason): hand held assist x1 to take few steps to recliner chair, steadying assist for safety. patient denies using a walker at home Toileting- Architect and Hygiene: Minimal assistance;Sit to/from stand       Functional mobility during ADLs: Minimal assistance General ADL Comments: unsure of patient's baseline however patient overall set up/supervision for UB ADL and min A for LB ADL/transfers for steadying and safety                  Pertinent Vitals/Pain Pain Assessment: Faces Faces Pain Scale: No hurt     Hand Dominance  (did not specifu)   Extremity/Trunk Assessment Upper Extremity Assessment Upper Extremity Assessment: Generalized weakness   Lower Extremity Assessment Lower Extremity Assessment: Defer to PT evaluation       Communication Communication Communication: HOH   Cognition Arousal/Alertness: Awake/alert Behavior During Therapy: WFL for tasks assessed/performed Overall Cognitive Status: No family/caregiver present to determine baseline cognitive  functioning                                 General Comments: patient disoriented to place, time, situation. very pleasant and following directions appropriately (some repetition of directions 2* HOH)              Home Living Family/patient expects  to be discharged to:: Private residence Living Arrangements: Children (son)                               Additional Comments: patient unable to provide info is pleasantly confused      Prior Functioning/Environment          Comments: unsure of level of assist at home as patient is pleasantly confused, per chart son states patient was ambulatory around the house        OT Problem List: Decreased activity tolerance;Impaired balance (sitting and/or standing);Decreased strength      OT Treatment/Interventions: Self-care/ADL training;Therapeutic activities;Patient/family education;Balance training    OT Goals(Current goals can be found in the care plan section) Acute Rehab OT Goals Patient Stated Goal: eat lunch OT Goal Formulation: With patient Time For Goal Achievement: 05/03/20 Potential to Achieve Goals: Good  OT Frequency: Min 2X/week    AM-PAC OT "6 Clicks" Daily Activity     Outcome Measure Help from another person eating meals?: None Help from another person taking care of personal grooming?: A Little Help from another person toileting, which includes using toliet, bedpan, or urinal?: A Little Help from another person bathing (including washing, rinsing, drying)?: A Little Help from another person to put on and taking off regular upper body clothing?: A Little Help from another person to put on and taking off regular lower body clothing?: A Little 6 Click Score: 19   End of Session Nurse Communication: Mobility status  Activity Tolerance: Patient tolerated treatment well Patient left: in chair;with call bell/phone within reach;with chair alarm set  OT Visit Diagnosis: Other abnormalities of gait and mobility (R26.89);Muscle weakness (generalized) (M62.81)                Time: 1244-1300 OT Time Calculation (min): 16 min Charges:  OT General Charges $OT Visit: 1 Visit OT Evaluation $OT Eval Low Complexity: [place] OT OT pager:  [place] 04/19/2020, 1:41 PM

## 2020-04-19 NOTE — Evaluation (Signed)
Physical Therapy Evaluation Patient Details Name: Anne Rice MRN: [id number] DOB: 10-Jan-1925 Today's Date: 04/19/2020   History of Present Illness  Patient is a 86 year old female PMH  of HTN, diet-controlled diabetes, mild dementia who presents to the hospital from PCP due to abnormal labs including leukocytosis and hypercalcemia. Son reports pt suffered a TIA 01/2020 and since that time, patient has slowly declined, appetite has decreased and patient has gotten gradually weaker.  Clinical Impression  Pt admitted with above diagnosis.  Pt pleasantly confused and cooperative. Son present and encouraging pt to mobilize. Pt has DME but does not like to use it, is unsteady but typically not a "furniture ambulator" per son.  Her gait/mobility are near baseline other than  requiring assist with bed mobility today, typically does not at home. Son feels pt may not be fully cooperative with therapy at home, therefore no f/u recommended. Son states he can provide 24hr assist/supervision. Will continue PT in acute setting.   Pt currently with functional limitations due to the deficits listed below (see PT Problem List). Pt will benefit from skilled PT to increase their independence and safety with mobility to allow discharge to the venue listed below.       Follow Up Recommendations No PT follow up (declines/per son pt does not like any form of "exercise")    Equipment Recommendations  None recommended by PT    Recommendations for Other Services       Precautions / Restrictions Precautions Precautions: Fall Restrictions Weight Bearing Restrictions: No      Mobility  Bed Mobility Overal bed mobility: Needs Assistance Bed Mobility: Supine to Sit;Sit to Supine     Supine to sit: Min assist Sit to supine: Min assist   General bed mobility comments: assist to bring LEs off bed, and lift them on  to bed    Transfers Overall transfer level: Needs assistance Equipment used: 1 person  hand held assist Transfers: Sit to/from Stand Sit to Stand: Min assist Stand pivot transfers: Min assist       General transfer comment: light assist to rise, tends to shift wt anteriorly  Ambulation/Gait Ambulation/Gait assistance: Min assist Gait Distance (Feet): 50 Feet Assistive device: None;1 person hand held assist Gait Pattern/deviations: Step-through pattern;Decreased stride length;Wide base of support;Drifts right/left     General Gait Details: unsteady, near festinating gait at times, pt slows speed with repetitious verbal cues.  Stairs            Wheelchair Mobility    Modified Rankin (Stroke Patients Only)       Balance Overall balance assessment: Needs assistance   Sitting balance-Leahy Scale: Fair     Standing balance support: No upper extremity supported;During functional activity Standing balance-Leahy Scale: Fair Standing balance comment: relaint on external support for safe dynamic activites                             Pertinent Vitals/Pain Pain Assessment: Faces Faces Pain Scale: No hurt    Home Living Family/patient expects to be discharged to:: Private residence Living Arrangements: Children (son) Available Help at Discharge: Family Type of Home: House         Home Equipment: Gilford Rile - 2 wheels;Cane - single point Additional Comments: patient unable to provide info is pleasantly confused    Prior Function           Comments: household ambulator per son, prefers not to use DME.  does not like to "exercise" per son. prior to covid they shopped for exercise     Hand Dominance   Dominant Hand:  (did not specifu)    Extremity/Trunk Assessment   Upper Extremity Assessment Upper Extremity Assessment: Generalized weakness;Defer to OT evaluation    Lower Extremity Assessment Lower Extremity Assessment: Generalized weakness       Communication   Communication: HOH  Cognition Arousal/Alertness:  Awake/alert Behavior During Therapy: WFL for tasks assessed/performed Overall Cognitive Status: History of cognitive impairments - at baseline Area of Impairment: Memory;Safety/judgement                     Memory: Decreased short-term memory   Safety/Judgement: Decreased awareness of safety;Decreased awareness of deficits     General Comments: patient disoriented to place, time, situation. very pleasant and following directions appropriately (some repetition of directions 2* HOH)      General Comments      Exercises     Assessment/Plan    PT Assessment Patient needs continued PT services  PT Problem List Decreased strength;Decreased activity tolerance;Decreased balance;Decreased knowledge of use of DME;Decreased coordination;Decreased mobility       PT Treatment Interventions DME instruction;Therapeutic activities;Gait training;Functional mobility training;Therapeutic exercise;Patient/family education;Balance training    PT Goals (Current goals can be found in the Care Plan section)  Acute Rehab PT Goals Patient Stated Goal: to go home soon PT Goal Formulation: With family Time For Goal Achievement: 05/02/20 Potential to Achieve Goals: Good    Frequency Min 3X/week   Barriers to discharge        Co-evaluation               AM-PAC PT "6 Clicks" Mobility  Outcome Measure Help needed turning from your back to your side while in a flat bed without using bedrails?: A Little Help needed moving from lying on your back to sitting on the side of a flat bed without using bedrails?: A Little Help needed moving to and from a bed to a chair (including a wheelchair)?: A Little Help needed standing up from a chair using your arms (e.g., wheelchair or bedside chair)?: A Little Help needed to walk in hospital room?: A Little Help needed climbing 3-5 steps with a railing? : A Lot 6 Click Score: 17    End of Session Equipment Utilized During Treatment: Gait  belt Activity Tolerance: Patient tolerated treatment well Patient left: with call bell/phone within reach;in bed;with bed alarm set Nurse Communication: Mobility status PT Visit Diagnosis: Unsteadiness on feet (R26.81);Other abnormalities of gait and mobility (R26.89)    Time: 9024-0973 PT Time Calculation (min) (ACUTE ONLY): 17 min   Charges:   PT Evaluation $PT Eval Low Complexity: 1 Low          Hannahmarie Asberry, PT  Acute Rehab Dept ([facility]) [phone] Pager [phone]  04/19/2020   [facility] 04/19/2020, 4:45 PM

## 2020-04-19 NOTE — Progress Notes (Signed)
OT Cancellation Note  Patient Details Name: Anne Rice MRN: [id number] DOB: 04-28-1924   Cancelled Treatment:    Reason Eval/Treat Not Completed: Patient at procedure or test/ unavailable. Will check back as schedule permits.  [name] OT OT pager: [phone]   [name] 04/19/2020, 10:45 AM

## 2020-04-19 NOTE — Progress Notes (Signed)
SLP Cancellation Note  Patient Details Name: Anne Rice MRN: [id number] DOB: 1924/09/18   Cancelled treatment:       Reason Eval/Treat Not Completed: Patient at procedure or test/unavailable. Patient NPO for EGD at 1030. SLP will attempt to check back later this PM for ability to see patient for BSE.  Angela Nevin, MA, CCC-SLP Speech Therapy

## 2020-04-19 NOTE — Anesthesia Procedure Notes (Signed)
Procedure Name: MAC Date/Time: 04/19/2020 10:59 AM Performed by: Niel Hummer, CRNA Pre-anesthesia Checklist: Patient identified, Emergency Drugs available, Suction available and Patient being monitored Oxygen Delivery Method: Simple face mask

## 2020-04-19 NOTE — Plan of Care (Signed)
  Problem: Safety: Goal: Ability to remain free from injury will improve Description: Patient will be free from injury on 04/21/20 Outcome: Progressing

## 2020-04-19 NOTE — Progress Notes (Signed)
Report received from ED RN.

## 2020-04-19 NOTE — Anesthesia Postprocedure Evaluation (Signed)
Anesthesia Post Note  Patient: Anne Rice  Procedure(s) Performed: ESOPHAGOGASTRODUODENOSCOPY (EGD) (N/A ) BIOPSY     Patient location during evaluation: Endoscopy Anesthesia Type: MAC Level of consciousness: awake and alert Pain management: pain level controlled Vital Signs Assessment: post-procedure vital signs reviewed and stable Respiratory status: spontaneous breathing, nonlabored ventilation and respiratory function stable Cardiovascular status: blood pressure returned to baseline and stable Postop Assessment: no apparent nausea or vomiting Anesthetic complications: no   No complications documented.  Last Vitals:  Vitals:   04/19/20 1150 04/19/20 1216  BP: 139/65 (!) 133/56  Pulse: 71 62  Resp: (!) 25 18  Temp:  36.4 C  SpO2: 96% 97%    Last Pain:  Vitals:   04/19/20 1216  TempSrc: Oral  PainSc:                  Merlinda Frederick

## 2020-04-19 NOTE — Progress Notes (Signed)
  Echocardiogram 2D Echocardiogram was attempted but patient was in endoscopy. We will try again later.   Anne Rice 04/19/2020, 9:51 AM

## 2020-04-19 NOTE — Progress Notes (Addendum)
PROGRESS NOTE    Anne Rice  [id number] DOB: 24-May-1924 DOA: 04/18/2020 PCP: Patient, No Pcp Per     Brief Narrative:  Anne Rice is a 86 y.o. female with medical history significant of hypertension, diet-controlled diabetes, mild dementia who presents to the hospital from her primary care physician's office due to abnormal labs including leukocytosis and hypercalcemia.  Son who is patient's primary caregiver is at bedside and provides most of the history.  He states that she suffered a TIA at the end of October 2021.  Since that time, patient has slowly declined, appetite has decreased and patient has gotten gradually weaker.  She went to her primary care physician's office with chief complaint of fatigue, bilateral lower extremity edema and weight loss.  Patient has no complaints of fevers, chills, nausea, vomiting, diarrhea or dysuria.  She does admit to some epigastric abdominal pain that comes and goes.  Patient does admit to some substernal chest pain with swallowing solid food.  At baseline, patient son states that patient is able to walk around the house, is typically alert and oriented x3 but is forgetful and needs frequent reminders regarding time.  She does need assistance with bathing and other simple activities of daily living. Labs reveal hypercalcemia calcium 11.5, leukocytosis WBC 21, negative UA.  CT chest abdomen pelvis was completed which revealed moderate sized hiatal hernia as well as irregularity of the distal portion of the esophagus concerning for possible esophagitis versus neoplasm as well as right thyroid mass.  Patient was given IV fluid and referred for admission to the hospital.  New events last 24 hours / Subjective: Patient remains pleasantly confused.  She is oriented to self only this morning.  She has no physical complaints.  Assessment & Plan:   Active Problems:   Hypercalcemia   Hypercalcemia -Unknown etiology -PTH is normal at 26 -Vitamin D  level, PTHrP pending at this time -Improved overnight with IV fluid  Leukocytosis -Unclear that there is a source of infection at this time, question if this could be secondary to dehydration. Patient has no complaints of fevers or chills or other complaints that could indicate underlying infection  -UA negative -Check blood cultures  -Covid negative  -Monitor off of antibiotics for now, repeat labs in the morning -Improved overnight with IV fluid   Esophageal thickening and irregularity seen on CT abdomen -?Esophagitis vs neoplasm -GI consulted, EGD 12/28  4.4cm right thyroid mass -Thyroid US solitary nodule/mass nearly replacing the right lobe of the thyroid -TSH within normal -IR consult for thyroid mass biopsy -- apparently, unable to do this as inpatient. Schedulers will call son to make outpatient appointment  New onset A Fib, paroxysmal -CHA2DS2-VASc score 5  -Only on aspirin as outpatient, will need to address anticoagulation once above work up is complete -Normal sinus rhythm on exam/telemetry today -Echo pending  Chronic diastolic HF -Echo 0000000 showed EF 60 to 65% with grade 1 diastolic dysfunction -Repeat echo  Essential hypertension -Hold Norvasc in setting of bilateral lower extremity edema  Diet-controlled diabetes -Ha1c 6.2 -SSI   Mild dementia -Stable     DVT prophylaxis:  enoxaparin (LOVENOX) injection 40 mg Start: 04/18/20 2245  Code Status: DNR Family Communication: No family at bedside, updated son over the phone this morning Disposition Plan:  Status is: Inpatient  Remains inpatient appropriate because:Ongoing diagnostic testing needed not appropriate for outpatient work up   Dispo: The patient is from: Home  Anticipated d/c is to: Home              Anticipated d/c date is: 2 days              Patient currently is not medically stable to d/c.  EGD this morning.  Further work-up regarding thyroid mass    Consultants:    GI  IR  Procedures:   EGD 12/28  Antimicrobials:  Anti-infectives (From admission, onward)   None        Objective: Vitals:   04/19/20 0000 04/19/20 0344 04/19/20 0756 04/19/20 0954  BP: (!) 124/50 133/73 (!) 143/54 (!) 167/55  Pulse: 74 82 70 72  Resp: 16 16 20 17   Temp:  97.7 F (36.5 C) 97.7 F (36.5 C) 98.6 F (37 C)  TempSrc:  Oral Oral Axillary  SpO2: 97% 98% 99% 96%  Weight:  79.2 kg    Height:        Intake/Output Summary (Last 24 hours) at 04/19/2020 1059 Last data filed at 04/19/2020 0342 Gross per 24 hour  Intake 2159.92 ml  Output 150 ml  Net 2009.92 ml   Filed Weights   04/18/20 1019 04/19/20 0344  Weight: 76.7 kg 79.2 kg    Examination:  General exam: Appears calm and comfortable  Respiratory system: Clear to auscultation. Respiratory effort normal. No respiratory distress. No conversational dyspnea.  Cardiovascular system: S1 & S2 heard, RRR. No murmurs. No pedal edema. Gastrointestinal system: Abdomen is nondistended, soft and nontender. Normal bowel sounds heard. Central nervous system: Alert and oriented to self only Extremities: Symmetric in appearance  Skin: No rashes, lesions or ulcers on exposed skin  Psychiatry: Affect appropriate.   Data Reviewed: I have personally reviewed following labs and imaging studies  CBC: Recent Labs  Lab 04/18/20 1020 04/18/20 1300 04/19/20 0615  WBC 20.3* 21.0* 14.9*  NEUTROABS  --  16.8*  --   HGB 11.8 11.9* 10.0*  HCT 35.7 37.3 31.2*  MCV 87.2 88.2 89.7  PLT  --  528* 0000000*   Basic Metabolic Panel: Recent Labs  Lab 04/18/20 0950 04/18/20 1300 04/18/20 1618 04/19/20 0615  NA  --  135  --  139  K  --  4.4  --  3.9  CL  --  98  --  105  CO2  --  27  --  25  GLUCOSE  --  117*  --  103*  BUN  --  26*  --  24*  CREATININE  --  0.72  --  0.64  CALCIUM 11.7* 11.5* 10.5* 10.4*  MG  --  2.1  --   --    GFR: Estimated Creatinine Clearance: 40.9 mL/min (by C-G formula based on SCr of  0.64 mg/dL). Liver Function Tests: Recent Labs  Lab 04/18/20 1300 04/19/20 0615  AST 22 17  ALT 19 16  ALKPHOS 144* 118  BILITOT 0.3 0.4  PROT 7.8 6.2*  ALBUMIN 2.9* 2.4*   No results for input(s): LIPASE, AMYLASE in the last 168 hours. No results for input(s): AMMONIA in the last 168 hours. Coagulation Profile: No results for input(s): INR, PROTIME in the last 168 hours. Cardiac Enzymes: No results for input(s): CKTOTAL, CKMB, CKMBINDEX, TROPONINI in the last 168 hours. BNP (last 3 results) No results for input(s): PROBNP in the last 8760 hours. HbA1C: Recent Labs    04/18/20 1300  HGBA1C 6.2*   CBG: Recent Labs  Lab 04/19/20 0817  GLUCAP 79   Lipid Profile: No  results for input(s): CHOL, HDL, LDLCALC, TRIG, CHOLHDL, LDLDIRECT in the last 72 hours. Thyroid Function Tests: Recent Labs    04/18/20 1618  TSH 0.931   Anemia Panel: No results for input(s): VITAMINB12, FOLATE, FERRITIN, TIBC, IRON, RETICCTPCT in the last 72 hours. Sepsis Labs: No results for input(s): PROCALCITON, LATICACIDVEN in the last 168 hours.  Recent Results (from the past 240 hour(s))  SARS CORONAVIRUS 2 (TAT 6-24 HRS) Nasopharyngeal Nasopharyngeal Swab     Status: None   Collection Time: 04/18/20  4:18 PM   Specimen: Nasopharyngeal Swab  Result Value Ref Range Status   SARS Coronavirus 2 NEGATIVE NEGATIVE Final    Comment: (NOTE) SARS-CoV-2 target nucleic acids are NOT DETECTED.  The SARS-CoV-2 RNA is generally detectable in upper and lower respiratory specimens during the acute phase of infection. Negative results do not preclude SARS-CoV-2 infection, do not rule out co-infections with other pathogens, and should not be used as the sole basis for treatment or other patient management decisions. Negative results must be combined with clinical observations, patient history, and epidemiological information. The expected result is Negative.  Fact Sheet for  Patients: SugarRoll.be  Fact Sheet for Healthcare Providers: https://www.woods-mathews.com/  This test is not yet approved or cleared by the [place] FDA and  has been authorized for detection and/or diagnosis of SARS-CoV-2 by FDA under an Emergency Use Authorization (EUA). This EUA will remain  in effect (meaning this test can be used) for the duration of the COVID-19 declaration under Se ction 564(b)(1) of the Act, 21 U.S.C. section 360bbb-3(b)(1), unless the authorization is terminated or revoked sooner.  Performed at [facility], [place] [address] [place], [place] [address]       Radiology Studies: CT Chest W Contrast  Result Date: 04/18/2020 CLINICAL DATA:  Weight loss, back and flank pain. EXAM: CT CHEST, ABDOMEN, AND PELVIS WITH CONTRAST TECHNIQUE: Multidetector CT imaging of the chest, abdomen and pelvis was performed following the standard protocol during bolus administration of intravenous contrast. CONTRAST:  110mL OMNIPAQUE IOHEXOL 300 MG/ML  SOLN COMPARISON:  None. FINDINGS: CT CHEST FINDINGS Cardiovascular: Atherosclerosis of thoracic aorta is noted without aneurysm or dissection. Mild cardiomegaly is noted. No pericardial effusion is noted. Coronary artery calcifications are noted. Mediastinum/Nodes: 4.4 cm right thyroid mass is noted. No adenopathy is noted. The proximal esophagus is dilated. Moderate size sliding-type hiatal hernia is noted. It is also thickened and irregular in appearance and its distal portion concerning for possible esophagitis or neoplasm. Lungs/Pleura: No pneumothorax or pleural effusion is noted. Calcified granuloma is noted in right middle lobe. Minimal bibasilar subsegmental atelectasis is noted. Musculoskeletal: No chest wall mass or suspicious bone lesions identified. CT ABDOMEN PELVIS FINDINGS Hepatobiliary: No focal liver abnormality is seen. No gallstones, gallbladder wall thickening, or biliary  dilatation. Pancreas: Unremarkable. No pancreatic ductal dilatation or surrounding inflammatory changes. Spleen: Calcified splenic granulomata are noted. Adrenals/Urinary Tract: Adrenal glands are unremarkable. Kidneys are normal, without renal calculi, focal lesion, or hydronephrosis. Bladder is unremarkable. Stomach/Bowel: There is no evidence of bowel obstruction or inflammation. Status post appendectomy. Vascular/Lymphatic: Aortic atherosclerosis. No enlarged abdominal or pelvic lymph nodes. Reproductive: Status post hysterectomy. No adnexal masses. Other: No abdominal wall hernia or abnormality. No abdominopelvic ascites. Musculoskeletal: No acute or significant osseous findings. IMPRESSION: 1. Moderate size sliding-type hiatal hernia is noted. The esophagus is also thickened and irregular in appearance in its distal portion concerning for possible esophagitis or neoplasm. Endoscopy is recommended for further evaluation. 2. 4.4 cm right  thyroid mass is noted. Recommend thyroid US. (Ref: J Am Coll Radiol. 2015 Feb;12(2): 143-50). 3. Coronary artery calcifications are noted suggesting coronary artery disease. 4. No other significant abnormality seen in the chest, abdomen or pelvis. 5. Aortic atherosclerosis. Aortic Atherosclerosis (ICD10-I70.0). Electronically Signed   By: Lupita Raider M.D.   On: 04/18/2020 14:48   CT Abdomen Pelvis W Contrast  Result Date: 04/18/2020 CLINICAL DATA:  Weight loss, back and flank pain. EXAM: CT CHEST, ABDOMEN, AND PELVIS WITH CONTRAST TECHNIQUE: Multidetector CT imaging of the chest, abdomen and pelvis was performed following the standard protocol during bolus administration of intravenous contrast. CONTRAST:  OMNIPAQUE IOHEXOL 300 MG/ML  SOLN COMPARISON:  None. FINDINGS: CT CHEST FINDINGS Cardiovascular: Atherosclerosis of thoracic aorta is noted without aneurysm or dissection. Mild cardiomegaly is noted. No pericardial effusion is noted. Coronary artery  calcifications are noted. Mediastinum/Nodes: 4.4 cm right thyroid mass is noted. No adenopathy is noted. The proximal esophagus is dilated. Moderate size sliding-type hiatal hernia is noted. It is also thickened and irregular in appearance and its distal portion concerning for possible esophagitis or neoplasm. Lungs/Pleura: No pneumothorax or pleural effusion is noted. Calcified granuloma is noted in right middle lobe. Minimal bibasilar subsegmental atelectasis is noted. Musculoskeletal: No chest wall mass or suspicious bone lesions identified. CT ABDOMEN PELVIS FINDINGS Hepatobiliary: No focal liver abnormality is seen. No gallstones, gallbladder wall thickening, or biliary dilatation. Pancreas: Unremarkable. No pancreatic ductal dilatation or surrounding inflammatory changes. Spleen: Calcified splenic granulomata are noted. Adrenals/Urinary Tract: Adrenal glands are unremarkable. Kidneys are normal, without renal calculi, focal lesion, or hydronephrosis. Bladder is unremarkable. Stomach/Bowel: There is no evidence of bowel obstruction or inflammation. Status post appendectomy. Vascular/Lymphatic: Aortic atherosclerosis. No enlarged abdominal or pelvic lymph nodes. Reproductive: Status post hysterectomy. No adnexal masses. Other: No abdominal wall hernia or abnormality. No abdominopelvic ascites. Musculoskeletal: No acute or significant osseous findings. IMPRESSION: 1. Moderate size sliding-type hiatal hernia is noted. The esophagus is also thickened and irregular in appearance in its distal portion concerning for possible esophagitis or neoplasm. Endoscopy is recommended for further evaluation. 2. 4.4 cm right thyroid mass is noted. Recommend thyroid US. (Ref: J Am Coll Radiol. 2015 Feb;12(2): 143-50). 3. Coronary artery calcifications are noted suggesting coronary artery disease. 4. No other significant abnormality seen in the chest, abdomen or pelvis. 5. Aortic atherosclerosis. Aortic Atherosclerosis  (ICD10-I70.0). Electronically Signed   By: Lupita Raider M.D.   On: 04/18/2020 14:48   US THYROID  Result Date: 04/19/2020 CLINICAL DATA:  Incidental on CT. Right-sided thyroid nodule incidentally noted on chest CT performed 04/18/2020 EXAM: THYROID ULTRASOUND TECHNIQUE: Ultrasound examination of the thyroid gland and adjacent soft tissues was performed. COMPARISON:  Chest CT-04/18/2020 FINDINGS: Parenchymal Echotexture: Mildly heterogenous Isthmus: Normal in size measuring 0.6 cm in diameter Right lobe: Enlarged measures 6.8 x 3.7 x 3.5 cm Left lobe: Atrophic measuring 2.9 x 1.5 x 1.2 cm _________________________________________________________ Estimated total number of nodules >/= 1 cm: 1 Number of spongiform nodules >/=  2 cm not described below (TR1): 0 Number of mixed cystic and solid nodules >/= 1.5 cm not described below (TR2): 0 _________________________________________________________ Nodule # 1: Location: Right; Mid Maximum size: 5.8 cm; Other 2 dimensions: 4.1 x 3.9 cm Composition: solid/almost completely solid (2) Echogenicity: hyperechoic (1) Shape: taller-than-wide (3) Margins: ill-defined (0) Echogenic foci: none (0) ACR TI-RADS total points: 6. ACR TI-RADS risk category: TR4 (4-6 points). ACR TI-RADS recommendations: **Given size (>/= 1.5 cm) and appearance, fine needle aspiration of  this moderately suspicious nodule should be considered based on TI-RADS criteria. _________________________________________________________ IMPRESSION: Solitary nodule/mass, nearly replacing the right lobe of the thyroid, correlating with the nodule seen on preceding chest CT, meets imaging criteria to recommend percutaneous sampling as indicated. The above is in keeping with the ACR TI-RADS recommendations - J Am Coll Radiol 2017;14:587-595. Electronically Signed   By: Sandi Mariscal M.D.   On: 04/19/2020 07:38      Scheduled Meds: . [MAR Hold] enoxaparin (LOVENOX) injection  40 mg Subcutaneous Q24H  . [MAR  Hold] insulin aspart  0-9 Units Subcutaneous TID WC  . [MAR Hold] pantoprazole (PROTONIX) IV  40 mg Intravenous Q12H   Continuous Infusions: . sodium chloride 100 mL/hr at 04/19/20 0328     LOS: 1 day      Time spent: 35 minutes   Dessa Phi, DO [facility] 04/19/2020, 10:59 AM   Available via Epic secure chat 7am-7pm After these hours, please refer to coverage provider listed on [email or web address]

## 2020-04-20 LAB — URINE CULTURE

## 2020-04-20 LAB — COMPREHENSIVE METABOLIC PANEL
ALT: 15 U/L (ref 0–44)
AST: 19 U/L (ref 15–41)
Albumin: 2.4 g/dL — ABNORMAL LOW (ref 3.5–5.0)
Alkaline Phosphatase: 115 U/L (ref 38–126)
Anion gap: 10 (ref 5–15)
BUN: 18 mg/dL (ref 8–23)
CO2: 21 mmol/L — ABNORMAL LOW (ref 22–32)
Calcium: 10.1 mg/dL (ref 8.9–10.3)
Chloride: 108 mmol/L (ref 98–111)
Creatinine, Ser: 0.62 mg/dL (ref 0.44–1.00)
GFR, Estimated: >60 mL/min (ref >60)
Glucose, Bld: 84 mg/dL (ref 70–99)
Potassium: 3.7 mmol/L (ref 3.5–5.1)
Sodium: 139 mmol/L (ref 135–145)
Total Bilirubin: 0.3 mg/dL (ref 0.3–1.2)
Total Protein: 6.3 g/dL — ABNORMAL LOW (ref 6.5–8.1)

## 2020-04-20 LAB — CBC
HCT: 34 % — ABNORMAL LOW (ref 36.0–46.0)
Hemoglobin: 10.7 g/dL — ABNORMAL LOW (ref 12.0–15.0)
MCH: 28.4 pg (ref 26.0–34.0)
MCHC: 31.5 g/dL (ref 30.0–36.0)
MCV: 90.2 fL (ref 80.0–100.0)
Platelets: 505 10*3/uL — ABNORMAL HIGH (ref 150–400)
RBC: 3.77 MIL/uL — ABNORMAL LOW (ref 3.87–5.11)
RDW: 14.1 % (ref 11.5–15.5)
WBC: 10.8 10*3/uL — ABNORMAL HIGH (ref 4.0–10.5)
nRBC: 0 % (ref 0.0–0.2)

## 2020-04-20 LAB — SURGICAL PATHOLOGY

## 2020-04-20 LAB — GLUCOSE, CAPILLARY
Glucose-Capillary: 80 mg/dL (ref 70–99)
Glucose-Capillary: 87 mg/dL (ref 70–99)
Glucose-Capillary: 108 mg/dL — ABNORMAL HIGH (ref 70–99)
Glucose-Capillary: 185 mg/dL — ABNORMAL HIGH (ref 70–99)

## 2020-04-20 LAB — VITAMIN D 25 HYDROXY (VIT D DEFICIENCY, FRACTURES): Vit D, 25-Hydroxy: 24.9 ng/mL — ABNORMAL LOW (ref 30–100)

## 2020-04-20 MED ORDER — ENSURE ENLIVE PO LIQD
237.0000 mL | Freq: Two times a day (BID) | ORAL | Status: DC
  Administered 2020-04-20 – 2020-04-21 (×2): 237 mL via ORAL

## 2020-04-20 MED ORDER — ACETAMINOPHEN 160 MG/5ML PO SOLN: 650.0000 mg | Freq: Four times a day (QID) | ORAL | Status: DC | PRN

## 2020-04-20 MED ORDER — ACETAMINOPHEN 650 MG RE SUPP: 650.0000 mg | Freq: Four times a day (QID) | RECTAL | Status: DC | PRN

## 2020-04-20 NOTE — Progress Notes (Signed)
Per RN on previous shift patient had an episode of desaturation throughout the night ranging in mid to low 80s. RN on previous shift  placed patient on 2L of 02. Per MD verbal order this RN rechecked 02 sat without oxygen and patient did not desat. Patient remained above 94% on room air without supplemental 02. 02 left off of patient at this time. Will continue to monitor.

## 2020-04-20 NOTE — Progress Notes (Signed)
Pharmacy Note   Pharmacy was consulted about possibility of changing pt meds to liquid forms due to severe esophagitis   Pantoprazole has a suspension form that we can use when she is ready to transition off the IV.   The only PO med ordered in house is acetaminophen and we can do a liquid form of that if needed.   Presuming that the aspirin and diclofenac will be stopped considering the diagnosis of severe esophagitis, but if aspirin cannot be stopped can use chewable form.   There is also an adult liquid MVI that can be transitioned to.   The only other med is amlodipine and that one does not have any commercially available liquid form. That med has not been ordered inpatient due to lower extremity edema as of now    This information was also communicated directly to Dr. Matthias Hughs via secure chat yesterday.    Adalberto Cole, PharmD, BCPS 04/20/2020 7:50 AM

## 2020-04-20 NOTE — TOC Initial Note (Signed)
Transition of Care [facility]) - Initial/Assessment Note    Patient Details  Name: Anne Rice MRN: [id number] Date of Birth: 1924-11-29  [facility] CM/SW Contact:    Bartholome Bill, RN Phone Number: 04/20/2020, 1:59 PM  Clinical Narrative:                 Spoke with pt son for dc planning. Choice offered for home health services and [facility] chosen. Son also requesting 3in1 and RW. Orders received and [employer] contacted to deliver equipment.  Expected Discharge Plan: Home w Home Health Services Barriers to Discharge: Continued Medical Work up   Patient Goals and CMS Choice Patient states their goals for this hospitalization and ongoing recovery are:: to get home CMS Medicare.gov Compare Post Acute Care list provided to:: Patient Represenative (must comment) [name]) Choice offered to / list presented to : Adult Children  Expected Discharge Plan and Services Expected Discharge Plan: Home w Home Health Services   Discharge Planning Services: CM Consult Post Acute Care Choice: Home Health Living arrangements for the past 2 months: Single Family Home                 DME Arranged: Dan Humphreys rolling,3-N-1 DME Agency: [employer] Date DME Agency Contacted: 04/20/20 Time DME Agency Contacted: 1200 Representative spoke with at DME Agency: Delman Kitten HH Arranged: RN HH Agency: [facility] Date [facility] Agency Contacted: 04/20/20 Time HH Agency Contacted: 1300 Representative spoke with at [facility] Agency: Kandee Keen  Prior Living Arrangements/Services Living arrangements for the past 2 months: Single Family Home Lives with:: Adult Children Patient language and need for interpreter reviewed:: Yes Do you feel safe going back to the place where you live?: Yes      Need for Family Participation in Patient Care: Yes (Comment) Care giver support system in place?: Yes (comment)   Criminal Activity/Legal Involvement Pertinent to Current Situation/Hospitalization: No - Comment as  needed  Activities of Daily Living Home Assistive Devices/Equipment: Walker (specify type),Cane (specify quad or straight) ADL Screening (condition at time of admission) Patient's cognitive ability adequate to safely complete daily activities?: No Is the patient deaf or have difficulty hearing?: Yes Does the patient have difficulty seeing, even when wearing glasses/contacts?: No Does the patient have difficulty concentrating, remembering, or making decisions?: No Patient able to express need for assistance with ADLs?: Yes Does the patient have difficulty dressing or bathing?: Yes Independently performs ADLs?: No Communication: Independent Dressing (OT): Needs assistance Is this a change from baseline?: Pre-admission baseline Grooming: Independent Feeding: Independent Bathing: Needs assistance Is this a change from baseline?: Pre-admission baseline Toileting: Needs assistance Is this a change from baseline?: Pre-admission baseline In/Out Bed: Needs assistance Is this a change from baseline?: Pre-admission baseline Walks in Home: Needs assistance Is this a change from baseline?: Pre-admission baseline Does the patient have difficulty walking or climbing stairs?: Yes Weakness of Legs: Both Weakness of Arms/Hands: Both  Permission Sought/Granted Permission sought to share information with : Oceanographer granted to share information with : Yes, Verbal Permission Granted     Permission granted to share info w AGENCY: [facility]        Emotional Assessment Appearance:: Appears stated age Attitude/Demeanor/Rapport: Self-Confident Affect (typically observed): Calm   Alcohol / Substance Use: Not Applicable    Admission diagnosis:  Hypercalcemia [E83.52] Other acute back pain [M54.9] Thyroid nodule greater than or equal to 1 cm in diameter incidentally noted on imaging study [E04.1] Patient Active Problem List   Diagnosis  Date Noted  . Hypercalcemia  04/18/2020  . Murmur 12/06/2015  . Type 2 diabetes mellitus (HCC) 01/11/2014  . Hyperlipidemia LDL goal <100 01/11/2014  . Obesity, unspecified 01/11/2014  . Basal cell carcinoma of skin 09/04/2013  . HTN (hypertension) 05/18/2013  . Dementia (HCC) 05/18/2013   PCP:  Patient, No Pcp Per Pharmacy:   [id number] [place], [place] - [phone] AT [facility] [address] [place] [address] Phone: [phone] Fax: [phone]     Social Determinants of Health (SDOH) Interventions    Readmission Risk Interventions No flowsheet data found.

## 2020-04-20 NOTE — Progress Notes (Signed)
PROGRESS NOTE   Anne Rice  [id number] DOB: Apr 01, 1925 DOA: 04/18/2020 PCP: Patient, No Pcp Per  Brief Narrative:  86 year old white female home dwelling HTN, DM TY 2 on diet control, mild dementia, TIA 01/2020 Found to have hypercalcemia calcium 11.5 WBC 21 UA negative Work-up revealed abnormal CT esophagus with sliding-type hiatal hernia irregular appearance-[facility] consulted 12/28 EGD = severe esophagitis deep ulcers no stricturing bleeding Also found to have paroxysmal A. fib on monitors and a thyroid mass    Assessment & Plan:   Active Problems:   Hypercalcemia   1. Severe esophagitis with Barrett's esophagus 2. Leukocytosis a. Both could be related b. White count is coming down without any antibiotic therapy?  Viral related esophagitis c. Blood cultures are negative so far and urine culture 12/27 multiple species d. No further current work-up-Barrett's esophagus is premalignant and her son is interested in goals of care and possible hospice at home 3. 4.4 cm thyroid mass a. No further work-up 4. New onset paroxysmal A. Fib a. GI to address feasibility of resumption of aspirin b. Patient had an aborted fall recently-would not use anticoagulation 5. HF PEF EF 60-65% 2017 6. HTN a. Amlodipine on hold b. Continue as needed metoprolol IV c. Resume amlodipine on discharge 7. Dementia a. Patient son tells me she is declined since September b. He is interested in hospice discussions and I have put in a TOC order 8. DM TY 2 diet controlled A1c 6.1 a. Liberalize and allow diet  DVT prophylaxis: Lovenox Code Status: DNR Family Communication: Discussed with patient's son Harvie Heck at the bedside-phone #[phone] Disposition:  Status is: Inpatient  Remains inpatient appropriate because:Hemodynamically unstable and Persistent severe electrolyte disturbances   Dispo: The patient is from: Home              Anticipated d/c is to: Home               Anticipated d/c date is: 1 day              Patient currently is medically stable to d/c.    Consultants:   GI  Procedures: EGD 12/28  Antimicrobials: None   Subjective: Pleasantly confused Son tells me had a meal of spaghetti earlier  today is quite sleepy   Objective: Vitals:   04/19/20 1140 04/19/20 1150 04/19/20 1216 04/19/20 2002  BP: (!) 147/58 139/65 (!) 133/56 (!) 143/74  Pulse: 69 71 62 65  Resp: 20 (!) 25 18   Temp:   97.6 F (36.4 C) 97.8 F (36.6 C)  TempSrc:   Oral   SpO2: 96% 96% 97% 98%  Weight:      Height:        Intake/Output Summary (Last 24 hours) at 04/20/2020 0748 Last data filed at 04/19/2020 1125 Gross per 24 hour  Intake 400 ml  Output --  Net 400 ml   Filed Weights   04/18/20 1019 04/19/20 0344  Weight: 76.7 kg 79.2 kg    Examination:  Pleasant confused Caucasian female no distress EOMI NCAT No pallor no icterus S1-S2 no murmur CTA B no rales no rhonchi Abdomen soft no rebound no guarding Neurologically intact moving all 4 limbs equally Cannot appreciate psych given lack of understanding  Data Reviewed: I have personally reviewed following labs and imaging studies White count 10.8 hemoglobin 10.7 platelet 505  Radiology Studies: CT Chest W Contrast  Result Date: 04/18/2020 CLINICAL DATA:  Weight loss, back and flank pain. EXAM: CT CHEST, ABDOMEN,  AND PELVIS WITH CONTRAST TECHNIQUE: Multidetector CT imaging of the chest, abdomen and pelvis was performed following the standard protocol during bolus administration of intravenous contrast. CONTRAST:  127mL OMNIPAQUE IOHEXOL 300 MG/ML  SOLN COMPARISON:  None. FINDINGS: CT CHEST FINDINGS Cardiovascular: Atherosclerosis of thoracic aorta is noted without aneurysm or dissection. Mild cardiomegaly is noted. No pericardial effusion is noted. Coronary artery calcifications are noted. Mediastinum/Nodes: 4.4 cm right thyroid mass is noted. No adenopathy is noted. The proximal esophagus is  dilated. Moderate size sliding-type hiatal hernia is noted. It is also thickened and irregular in appearance and its distal portion concerning for possible esophagitis or neoplasm. Lungs/Pleura: No pneumothorax or pleural effusion is noted. Calcified granuloma is noted in right middle lobe. Minimal bibasilar subsegmental atelectasis is noted. Musculoskeletal: No chest wall mass or suspicious bone lesions identified. CT ABDOMEN PELVIS FINDINGS Hepatobiliary: No focal liver abnormality is seen. No gallstones, gallbladder wall thickening, or biliary dilatation. Pancreas: Unremarkable. No pancreatic ductal dilatation or surrounding inflammatory changes. Spleen: Calcified splenic granulomata are noted. Adrenals/Urinary Tract: Adrenal glands are unremarkable. Kidneys are normal, without renal calculi, focal lesion, or hydronephrosis. Bladder is unremarkable. Stomach/Bowel: There is no evidence of bowel obstruction or inflammation. Status post appendectomy. Vascular/Lymphatic: Aortic atherosclerosis. No enlarged abdominal or pelvic lymph nodes. Reproductive: Status post hysterectomy. No adnexal masses. Other: No abdominal wall hernia or abnormality. No abdominopelvic ascites. Musculoskeletal: No acute or significant osseous findings. IMPRESSION: 1. Moderate size sliding-type hiatal hernia is noted. The esophagus is also thickened and irregular in appearance in its distal portion concerning for possible esophagitis or neoplasm. Endoscopy is recommended for further evaluation. 2. 4.4 cm right thyroid mass is noted. Recommend thyroid US. (Ref: J Am Coll Radiol. 2015 Feb;12(2): 143-50). 3. Coronary artery calcifications are noted suggesting coronary artery disease. 4. No other significant abnormality seen in the chest, abdomen or pelvis. 5. Aortic atherosclerosis. Aortic Atherosclerosis (ICD10-I70.0). Electronically Signed   By: Marijo Conception M.D.   On: 04/18/2020 14:48   CT Abdomen Pelvis W Contrast  Result Date:  04/18/2020 CLINICAL DATA:  Weight loss, back and flank pain. EXAM: CT CHEST, ABDOMEN, AND PELVIS WITH CONTRAST TECHNIQUE: Multidetector CT imaging of the chest, abdomen and pelvis was performed following the standard protocol during bolus administration of intravenous contrast. CONTRAST:  11mL OMNIPAQUE IOHEXOL 300 MG/ML  SOLN COMPARISON:  None. FINDINGS: CT CHEST FINDINGS Cardiovascular: Atherosclerosis of thoracic aorta is noted without aneurysm or dissection. Mild cardiomegaly is noted. No pericardial effusion is noted. Coronary artery calcifications are noted. Mediastinum/Nodes: 4.4 cm right thyroid mass is noted. No adenopathy is noted. The proximal esophagus is dilated. Moderate size sliding-type hiatal hernia is noted. It is also thickened and irregular in appearance and its distal portion concerning for possible esophagitis or neoplasm. Lungs/Pleura: No pneumothorax or pleural effusion is noted. Calcified granuloma is noted in right middle lobe. Minimal bibasilar subsegmental atelectasis is noted. Musculoskeletal: No chest wall mass or suspicious bone lesions identified. CT ABDOMEN PELVIS FINDINGS Hepatobiliary: No focal liver abnormality is seen. No gallstones, gallbladder wall thickening, or biliary dilatation. Pancreas: Unremarkable. No pancreatic ductal dilatation or surrounding inflammatory changes. Spleen: Calcified splenic granulomata are noted. Adrenals/Urinary Tract: Adrenal glands are unremarkable. Kidneys are normal, without renal calculi, focal lesion, or hydronephrosis. Bladder is unremarkable. Stomach/Bowel: There is no evidence of bowel obstruction or inflammation. Status post appendectomy. Vascular/Lymphatic: Aortic atherosclerosis. No enlarged abdominal or pelvic lymph nodes. Reproductive: Status post hysterectomy. No adnexal masses. Other: No abdominal wall hernia or abnormality. No abdominopelvic ascites. Musculoskeletal:  No acute or significant osseous findings. IMPRESSION: 1. Moderate  size sliding-type hiatal hernia is noted. The esophagus is also thickened and irregular in appearance in its distal portion concerning for possible esophagitis or neoplasm. Endoscopy is recommended for further evaluation. 2. 4.4 cm right thyroid mass is noted. Recommend thyroid US. (Ref: J Am Coll Radiol. 2015 Feb;12(2): 143-50). 3. Coronary artery calcifications are noted suggesting coronary artery disease. 4. No other significant abnormality seen in the chest, abdomen or pelvis. 5. Aortic atherosclerosis. Aortic Atherosclerosis (ICD10-I70.0). Electronically Signed   By: Marijo Conception M.D.   On: 04/18/2020 14:48   ECHOCARDIOGRAM COMPLETE  Result Date: 04/19/2020    ECHOCARDIOGRAM REPORT   Patient Name:   Anne Rice Date of Exam: 04/19/2020 Medical Rec #:  [id number]       Height:       60.0 in Accession #:    [id number]      Weight:       174.6 lb Date of Birth:  January 04, 1925       BSA:          1.762 m Patient Age:    61 years        BP:           133/73 mmHg Patient Gender: F               HR:           64 bpm. Exam Location:  Inpatient Procedure: 2D Echo Indications:    Atrial Fibrillation I48.91  History:        Patient has prior history of Echocardiogram examinations, most                 recent 12/19/2015. Signs/Symptoms:Murmur.  Sonographer:    Mikki Santee RDCS (AE) Referring Phys: [id number] JENNIFER CHOI IMPRESSIONS  1. Left ventricular ejection fraction, by estimation, is 60 to 65%. The left ventricle has normal function. The left ventricle has no regional wall motion abnormalities. Left ventricular diastolic parameters are consistent with Grade II diastolic dysfunction (pseudonormalization). Elevated left atrial pressure.  2. Right ventricular systolic function is normal. The right ventricular size is normal. There is mildly elevated pulmonary artery systolic pressure.  3. Left atrial size was mildly dilated.  4. Right atrial size was mildly dilated.  5. The mitral valve is normal in  structure. Trivial mitral valve regurgitation.  6. The aortic valve is tricuspid. There is mild calcification of the aortic valve. There is moderate thickening of the aortic valve. Aortic valve regurgitation is trivial. Mild aortic valve stenosis. Comparison(s): Prior images unable to be directly viewed, comparison made by report only. FINDINGS  Left Ventricle: Left ventricular ejection fraction, by estimation, is 60 to 65%. The left ventricle has normal function. The left ventricle has no regional wall motion abnormalities. The left ventricular internal cavity size was normal in size. There is  borderline left ventricular hypertrophy. Left ventricular diastolic parameters are consistent with Grade II diastolic dysfunction (pseudonormalization). Elevated left atrial pressure. Right Ventricle: The right ventricular size is normal. No increase in right ventricular wall thickness. Right ventricular systolic function is normal. There is mildly elevated pulmonary artery systolic pressure. The tricuspid regurgitant velocity is 3.03  m/s, and with an assumed right atrial pressure of 3 mmHg, the estimated right ventricular systolic pressure is [id number]. Left Atrium: Left atrial size was mildly dilated. Right Atrium: Right atrial size was mildly dilated. Pericardium: There is no evidence of pericardial effusion. Mitral Valve: The mitral  valve is normal in structure. Trivial mitral valve regurgitation. Tricuspid Valve: The tricuspid valve is normal in structure. Tricuspid valve regurgitation is mild. Aortic Valve: The aortic valve is tricuspid. There is mild calcification of the aortic valve. There is moderate thickening of the aortic valve. Aortic valve regurgitation is trivial. Mild aortic stenosis is present. Aortic valve mean gradient measures 18.5 mmHg. Aortic valve peak gradient measures 34.3 mmHg. Aortic valve area, by VTI measures 1.78 cm. Pulmonic Valve: The pulmonic valve was normal in structure. Pulmonic valve  regurgitation is not visualized. Aorta: The aortic root is normal in size and structure. IAS/Shunts: No atrial level shunt detected by color flow Doppler.  LEFT VENTRICLE PLAX 2D LVIDd:         4.00 cm  Diastology LVIDs:         2.70 cm  LV e' medial:    4.38 cm/s LV PW:         1.20 cm  LV E/e' medial:  16.6 LV IVS:        1.20 cm  LV e' lateral:   7.62 cm/s LVOT diam:     2.10 cm  LV E/e' lateral: 9.6 LV SV:         119 LV SV Index:   68 LVOT Area:     3.46 cm  RIGHT VENTRICLE RV S prime:     20.80 cm/s TAPSE (M-mode): 2.6 cm LEFT ATRIUM             Index       RIGHT ATRIUM           Index LA diam:        3.70 cm 2.10 cm/m  RA Area:     20.90 cm LA Vol (A2C):   66.2 ml 37.57 ml/m RA Volume:   56.90 ml  32.29 ml/m LA Vol (A4C):   43.8 ml 24.86 ml/m LA Biplane Vol: 57.6 ml 32.69 ml/m  AORTIC VALVE AV Area (Vmax):    1.54 cm AV Area (Vmean):   1.71 cm AV Area (VTI):     1.78 cm AV Vmax:           293.00 cm/s AV Vmean:          201.000 cm/s AV VTI:            0.670 m AV Peak Grad:      34.3 mmHg AV Mean Grad:      18.5 mmHg LVOT Vmax:         130.00 cm/s LVOT Vmean:        99.100 cm/s LVOT VTI:          0.344 m LVOT/AV VTI ratio: 0.51  AORTA Ao Root diam: 2.70 cm MITRAL VALVE               TRICUSPID VALVE MV Area (PHT): 2.62 cm    TR Peak grad:   36.7 mmHg MV Decel Time: 289 msec    TR Vmax:        303.00 cm/s MV E velocity: 72.80 cm/s MV A velocity: 87.80 cm/s  SHUNTS MV E/A ratio:  0.83        Systemic VTI:  0.34 m                            Systemic Diam: 2.10 cm Dani Gobble Croitoru MD Electronically signed by Sanda Klein MD Signature Date/Time: 04/19/2020/3:03:22 PM    Final  US THYROID  Result Date: 04/19/2020 CLINICAL DATA:  Incidental on CT. Right-sided thyroid nodule incidentally noted on chest CT performed 04/18/2020 EXAM: THYROID ULTRASOUND TECHNIQUE: Ultrasound examination of the thyroid gland and adjacent soft tissues was performed. COMPARISON:  Chest CT-04/18/2020 FINDINGS: Parenchymal  Echotexture: Mildly heterogenous Isthmus: Normal in size measuring 0.6 cm in diameter Right lobe: Enlarged measures 6.8 x 3.7 x 3.5 cm Left lobe: Atrophic measuring 2.9 x 1.5 x 1.2 cm _________________________________________________________ Estimated total number of nodules >/= 1 cm: 1 Number of spongiform nodules >/=  2 cm not described below (TR1): 0 Number of mixed cystic and solid nodules >/= 1.5 cm not described below ([place]): 0 _________________________________________________________ Nodule # 1: Location: Right; Mid Maximum size: 5.8 cm; Other 2 dimensions: 4.1 x 3.9 cm Composition: solid/almost completely solid (2) Echogenicity: hyperechoic (1) Shape: taller-than-wide (3) Margins: ill-defined (0) Echogenic foci: none (0) ACR TI-RADS total points: 6. ACR TI-RADS risk category: TR4 (4-6 points). ACR TI-RADS recommendations: **Given size (>/= 1.5 cm) and appearance, fine needle aspiration of this moderately suspicious nodule should be considered based on TI-RADS criteria. _________________________________________________________ IMPRESSION: Solitary nodule/mass, nearly replacing the right lobe of the thyroid, correlating with the nodule seen on preceding chest CT, meets imaging criteria to recommend percutaneous sampling as indicated. The above is in keeping with the ACR TI-RADS recommendations - J Am Coll Radiol 2017;14:587-595. Electronically Signed   By: Sandi Mariscal M.D.   On: 04/19/2020 07:38     Scheduled Meds:  enoxaparin (LOVENOX) injection  40 mg Subcutaneous Q24H   insulin aspart  0-9 Units Subcutaneous TID WC   pantoprazole (PROTONIX) IV  40 mg Intravenous Q12H   Continuous Infusions:  sodium chloride 100 mL/hr at 04/19/20 2241     LOS: 2 days    Time spent: [place], MD [facility] To contact the attending provider between 7A-7P or the covering provider during after hours 7P-7A, please log into the web site [email or web address] and access using universal [facility] password for that web site. If you do not have the password, please call the hospital operator.  04/20/2020, 7:48 AM

## 2020-04-20 NOTE — Progress Notes (Signed)
Patient's son, Anne Rice, was at the bedside today when I made rounds, which was very helpful since patient has organic brain syndrome.  I reviewed the endoscopic findings with him and his mother, and provide him a patient copy of the endoscopy report.  Recommendations (discussed with patient's son):  1.  Continue high-dose PPI therapy upon discharge.  (I think she will need to be on it indefinitely.)  2.  Avoidance of diclofenac (which she was on prior to admission) or other medications (such as Fosamax) which could be irritating to the esophagus if the pill did not pass into the stomach.  The patient's son was advised to notify the patient's physician of her history of severe esophagitis whenever a new medication is prescribed.  3.  When eating, take small boluses of food, interspersed with sips of liquid to help wash the food down  4.  In view of the patient's overall medical condition, our plan is to monitor the patient clinically, rather than assessing for esophageal healing by routine repeat endoscopy.   5.  Because the patient has significant mobility limitations and it is difficult for her to get in and out of a car to go to doctors appointments, follow-up with me in the office will be as needed rather than on a scheduled basis.  I provided Anne Rice with my card and encouraged him to call me if he senses that his mother is having ongoing problems with her esophagus, keeping in mind that improvement for this severity of esophagitis would probably occur gradually, over months.  We will sign off.  Please call us if you have questions or if we can be of further assistance in this patient's care.  Florencia Reasons, M.D. Pager [phone] If no answer or after 5 PM call [phone]

## 2020-04-21 LAB — GLUCOSE, CAPILLARY
Glucose-Capillary: 91 mg/dL (ref 70–99)
Glucose-Capillary: 109 mg/dL — ABNORMAL HIGH (ref 70–99)

## 2020-04-21 MED ORDER — MELATONIN 3 MG PO TABS: 6.0000 mg | ORAL_TABLET | Freq: Every evening | ORAL | 0 refills | Status: DC | PRN

## 2020-04-21 MED ORDER — PANTOPRAZOLE SODIUM 40 MG PO TBEC: 40.0000 mg | DELAYED_RELEASE_TABLET | Freq: Every day | ORAL | 1 refills | Status: DC

## 2020-04-21 MED ORDER — LANSOPRAZOLE 3 MG/ML SUSP: 15.0000 mg | Freq: Two times a day (BID) | ORAL | 2 refills | Status: DC

## 2020-04-21 MED ORDER — ENSURE ENLIVE PO LIQD: 237.0000 mL | Freq: Two times a day (BID) | ORAL | 12 refills | Status: DC

## 2020-04-21 MED ORDER — AMLODIPINE 1 MG/ML ORAL SUSPENSION: 2.5000 mg | Freq: Every day | ORAL | 3 refills | Status: DC

## 2020-04-21 NOTE — Discharge Summary (Addendum)
Physician Discharge Summary  EMLYNN DRISKILL [id number] DOB: February 19, 1925 DOA: 04/18/2020  PCP: Patient, No Pcp Per  Admit date: 04/18/2020 Discharge date: 04/21/2020  Time spent: 37 minutes  Recommendations for Outpatient Follow-up:  1. Recommend CBC and Chem-12 in about 1 week 2. Recommend home health and home palliative to see the patient in follow-up 3. Suggest outpatient goals of care 4. Discontinued aspirin in addition to ibuprofen this admission 5. Patient has a thyroid mass but would defer work-up of the same given dementia  Discharge Diagnoses:  Active Problems:   Hypercalcemia   Discharge Condition: Improved but overall guarded  Diet recommendation: Liberalize feeds to comfort feeds  Filed Weights   04/18/20 1019 04/19/20 0344  Weight: 76.7 kg 79.2 kg    History of present illness:  86 year old white female home dwelling HTN, DM TY 2 on diet control, mild dementia, TIA 01/2020 Found to have hypercalcemia calcium 11.5 WBC 21 UA negative Work-up revealed abnormal CT esophagus with sliding-type hiatal hernia irregular appearance-[facility] consulted 12/28 EGD = severe esophagitis deep ulcers no stricturing bleeding Also found to have paroxysmal A. fib on monitors and a thyroid mass  Hospital Course:  1. Severe esophagitis with Barrett's esophagus 2. Leukocytosis a. Both could be related b. White count is coming down without any antibiotic therapy?  Viral related esophagitis-this is resolved and he has no fevers c. Blood cultures are negative so far and urine culture 12/27 multiple species d. No further current work-up-Barrett's esophagus is premalignant and her son is interested in goals of care and possible hospice at home e. Discharge on oral lansoprazole liquid bid 3. 4.4 cm thyroid mass a. No further work-up at this juncture given goals are palliative in origin 4. New onset paroxysmal A. Fib a. GI feels from their perspective can use  aspirin b. Patient had an aborted fall recently-would not use anticoagulation or antiplatelet as high risk of hemorrhage bleeding which may be life-threatening 5. HF PEF EF 60-65% 2017 6. HTN a. Amlodipine on hold b. Continue as needed metoprolol IV c. Resumed amlodipine on discharge but as solution 7. Dementia a. Patient son tells me she is declined since September b. He is interested in hospice discussions and I have put in a TOC order 8. DM TY 2 diet controlled A1c 6.1 a. Liberalize and allow comfort diet   Procedures:  Impression:               - Severe esophagitis with extensive deep ulceration                            but no bleeding, malignancy not excluded. Biopsied.                           - 7 cm hiatal hernia.                           - Normal stomach.                           - Normal examined duodenum.  Consultations:  GI   Discharge Exam: Vitals:   04/21/20 0037 04/21/20 0526  BP:  (!) 151/67  Pulse:  67  Resp:  16  Temp: 97.6 F (36.4 C) 98.3 F (36.8 C)  SpO2:  92%    General: Awake incoherent pleasantly  confused Cardiovascular: s1 s2 no m/r/g Respiratory: clear no added soudn no rales no rhonchi abd soft slight tender no rebound  Discharge Instructions   Discharge Instructions    Diet - low sodium heart healthy   Complete by: As directed    Discharge instructions   Complete by: As directed    Recommend discontinuation of aspirin going forward given high risk of falls and hemorrhage needs to sit upright 90 degrees when eating and drinking to ensure does not develop any esophageal issues Will need to take Protonix twice daily ongoing lifelong more likely than not .   recommend home palliative care, can assess her situation given her gradual gentle decline   Increase activity slowly   Complete by: As directed      Allergies as of 04/21/2020   No Known Allergies     Medication List    STOP taking these medications   acetaminophen 500  MG tablet Commonly known as: TYLENOL   amLODipine 2.5 MG tablet Commonly known as: NORVASC   aspirin EC 81 MG tablet   diclofenac 50 MG EC tablet Commonly known as: VOLTAREN     TAKE these medications   feeding supplement Liqd Take 237 mLs by mouth 2 (two) times daily between meals.   melatonin 3 MG Tabs tablet Take 2 tablets (6 mg total) by mouth at bedtime as needed (sleep).   multivitamin with minerals Tabs tablet Take 1 tablet by mouth daily.   pantoprazole 40 MG tablet Commonly known as: Protonix Take 1 tablet (40 mg total) by mouth daily.            Durable Medical Equipment  (From admission, onward)         Start     Ordered   04/20/20 1143  For home use only DME 3 n 1  Once        04/20/20 1142   04/20/20 1143  For home use only DME Walker rolling  Once       Question Answer Comment  Walker: With 5 Inch Wheels   Patient needs a walker to treat with the following condition Weakness      04/20/20 1142         No Known Allergies    The results of significant diagnostics from this hospitalization (including imaging, microbiology, ancillary and laboratory) are listed below for reference.    Significant Diagnostic Studies: DG Lumbar Spine 2-3 Views  Result Date: 04/08/2020 CLINICAL DATA:  Low back pain EXAM: LUMBAR SPINE - 2-3 VIEW COMPARISON:  None. FINDINGS: Five lumbar type vertebral bodies. Slight levocurvature. Mild retrolisthesis at L2-L3. Grade 1 anterolisthesis at L4-L5 and L5-S1. Lumbar vertebral body heights are maintained. There is multilevel disc space narrowing. Multilevel facet hypertrophy. Aortic atherosclerosis. IMPRESSION: Advanced multilevel lumbar spondylosis. Electronically Signed   By: Macy Mis M.D.   On: 04/08/2020 12:26   CT Chest W Contrast  Result Date: 04/18/2020 CLINICAL DATA:  Weight loss, back and flank pain. EXAM: CT CHEST, ABDOMEN, AND PELVIS WITH CONTRAST TECHNIQUE: Multidetector CT imaging of the chest, abdomen  and pelvis was performed following the standard protocol during bolus administration of intravenous contrast. CONTRAST:  110mL OMNIPAQUE IOHEXOL 300 MG/ML  SOLN COMPARISON:  None. FINDINGS: CT CHEST FINDINGS Cardiovascular: Atherosclerosis of thoracic aorta is noted without aneurysm or dissection. Mild cardiomegaly is noted. No pericardial effusion is noted. Coronary artery calcifications are noted. Mediastinum/Nodes: 4.4 cm right thyroid mass is noted. No adenopathy is noted. The proximal esophagus is dilated.  Moderate size sliding-type hiatal hernia is noted. It is also thickened and irregular in appearance and its distal portion concerning for possible esophagitis or neoplasm. Lungs/Pleura: No pneumothorax or pleural effusion is noted. Calcified granuloma is noted in right middle lobe. Minimal bibasilar subsegmental atelectasis is noted. Musculoskeletal: No chest wall mass or suspicious bone lesions identified. CT ABDOMEN PELVIS FINDINGS Hepatobiliary: No focal liver abnormality is seen. No gallstones, gallbladder wall thickening, or biliary dilatation. Pancreas: Unremarkable. No pancreatic ductal dilatation or surrounding inflammatory changes. Spleen: Calcified splenic granulomata are noted. Adrenals/Urinary Tract: Adrenal glands are unremarkable. Kidneys are normal, without renal calculi, focal lesion, or hydronephrosis. Bladder is unremarkable. Stomach/Bowel: There is no evidence of bowel obstruction or inflammation. Status post appendectomy. Vascular/Lymphatic: Aortic atherosclerosis. No enlarged abdominal or pelvic lymph nodes. Reproductive: Status post hysterectomy. No adnexal masses. Other: No abdominal wall hernia or abnormality. No abdominopelvic ascites. Musculoskeletal: No acute or significant osseous findings. IMPRESSION: 1. Moderate size sliding-type hiatal hernia is noted. The esophagus is also thickened and irregular in appearance in its distal portion concerning for possible esophagitis or  neoplasm. Endoscopy is recommended for further evaluation. 2. 4.4 cm right thyroid mass is noted. Recommend thyroid US. (Ref: J Am Coll Radiol. 2015 Feb;12(2): 143-50). 3. Coronary artery calcifications are noted suggesting coronary artery disease. 4. No other significant abnormality seen in the chest, abdomen or pelvis. 5. Aortic atherosclerosis. Aortic Atherosclerosis (ICD10-I70.0). Electronically Signed   By: Marijo Conception M.D.   On: 04/18/2020 14:48   CT Abdomen Pelvis W Contrast  Result Date: 04/18/2020 CLINICAL DATA:  Weight loss, back and flank pain. EXAM: CT CHEST, ABDOMEN, AND PELVIS WITH CONTRAST TECHNIQUE: Multidetector CT imaging of the chest, abdomen and pelvis was performed following the standard protocol during bolus administration of intravenous contrast. CONTRAST:  14mL OMNIPAQUE IOHEXOL 300 MG/ML  SOLN COMPARISON:  None. FINDINGS: CT CHEST FINDINGS Cardiovascular: Atherosclerosis of thoracic aorta is noted without aneurysm or dissection. Mild cardiomegaly is noted. No pericardial effusion is noted. Coronary artery calcifications are noted. Mediastinum/Nodes: 4.4 cm right thyroid mass is noted. No adenopathy is noted. The proximal esophagus is dilated. Moderate size sliding-type hiatal hernia is noted. It is also thickened and irregular in appearance and its distal portion concerning for possible esophagitis or neoplasm. Lungs/Pleura: No pneumothorax or pleural effusion is noted. Calcified granuloma is noted in right middle lobe. Minimal bibasilar subsegmental atelectasis is noted. Musculoskeletal: No chest wall mass or suspicious bone lesions identified. CT ABDOMEN PELVIS FINDINGS Hepatobiliary: No focal liver abnormality is seen. No gallstones, gallbladder wall thickening, or biliary dilatation. Pancreas: Unremarkable. No pancreatic ductal dilatation or surrounding inflammatory changes. Spleen: Calcified splenic granulomata are noted. Adrenals/Urinary Tract: Adrenal glands are  unremarkable. Kidneys are normal, without renal calculi, focal lesion, or hydronephrosis. Bladder is unremarkable. Stomach/Bowel: There is no evidence of bowel obstruction or inflammation. Status post appendectomy. Vascular/Lymphatic: Aortic atherosclerosis. No enlarged abdominal or pelvic lymph nodes. Reproductive: Status post hysterectomy. No adnexal masses. Other: No abdominal wall hernia or abnormality. No abdominopelvic ascites. Musculoskeletal: No acute or significant osseous findings. IMPRESSION: 1. Moderate size sliding-type hiatal hernia is noted. The esophagus is also thickened and irregular in appearance in its distal portion concerning for possible esophagitis or neoplasm. Endoscopy is recommended for further evaluation. 2. 4.4 cm right thyroid mass is noted. Recommend thyroid US. (Ref: J Am Coll Radiol. 2015 Feb;12(2): 143-50). 3. Coronary artery calcifications are noted suggesting coronary artery disease. 4. No other significant abnormality seen in the chest, abdomen or pelvis. 5. Aortic atherosclerosis.  Aortic Atherosclerosis (ICD10-I70.0). Electronically Signed   By: Marijo Conception M.D.   On: 04/18/2020 14:48   ECHOCARDIOGRAM COMPLETE  Result Date: 04/19/2020    ECHOCARDIOGRAM REPORT   Patient Name:   LOISTEEN MUKHERJEE Date of Exam: 04/19/2020 Medical Rec #:  [id number]       Height:       60.0 in Accession #:    [id number]      Weight:       174.6 lb Date of Birth:  17-Feb-1925       BSA:          1.762 m Patient Age:    86 years        BP:           133/73 mmHg Patient Gender: F               HR:           64 bpm. Exam Location:  Inpatient Procedure: 2D Echo Indications:    Atrial Fibrillation I48.91  History:        Patient has prior history of Echocardiogram examinations, most                 recent 12/19/2015. Signs/Symptoms:Murmur.  Sonographer:    Mikki Santee RDCS (AE) Referring Phys: [id number] JENNIFER CHOI IMPRESSIONS  1. Left ventricular ejection fraction, by estimation, is 60 to 65%.  The left ventricle has normal function. The left ventricle has no regional wall motion abnormalities. Left ventricular diastolic parameters are consistent with Grade II diastolic dysfunction (pseudonormalization). Elevated left atrial pressure.  2. Right ventricular systolic function is normal. The right ventricular size is normal. There is mildly elevated pulmonary artery systolic pressure.  3. Left atrial size was mildly dilated.  4. Right atrial size was mildly dilated.  5. The mitral valve is normal in structure. Trivial mitral valve regurgitation.  6. The aortic valve is tricuspid. There is mild calcification of the aortic valve. There is moderate thickening of the aortic valve. Aortic valve regurgitation is trivial. Mild aortic valve stenosis. Comparison(s): Prior images unable to be directly viewed, comparison made by report only. FINDINGS  Left Ventricle: Left ventricular ejection fraction, by estimation, is 60 to 65%. The left ventricle has normal function. The left ventricle has no regional wall motion abnormalities. The left ventricular internal cavity size was normal in size. There is  borderline left ventricular hypertrophy. Left ventricular diastolic parameters are consistent with Grade II diastolic dysfunction (pseudonormalization). Elevated left atrial pressure. Right Ventricle: The right ventricular size is normal. No increase in right ventricular wall thickness. Right ventricular systolic function is normal. There is mildly elevated pulmonary artery systolic pressure. The tricuspid regurgitant velocity is 3.03  m/s, and with an assumed right atrial pressure of 3 mmHg, the estimated right ventricular systolic pressure is [id number]. Left Atrium: Left atrial size was mildly dilated. Right Atrium: Right atrial size was mildly dilated. Pericardium: There is no evidence of pericardial effusion. Mitral Valve: The mitral valve is normal in structure. Trivial mitral valve regurgitation. Tricuspid Valve: The  tricuspid valve is normal in structure. Tricuspid valve regurgitation is mild. Aortic Valve: The aortic valve is tricuspid. There is mild calcification of the aortic valve. There is moderate thickening of the aortic valve. Aortic valve regurgitation is trivial. Mild aortic stenosis is present. Aortic valve mean gradient measures 18.5 mmHg. Aortic valve peak gradient measures 34.3 mmHg. Aortic valve area, by VTI measures 1.78 cm. Pulmonic Valve: The pulmonic valve was  normal in structure. Pulmonic valve regurgitation is not visualized. Aorta: The aortic root is normal in size and structure. IAS/Shunts: No atrial level shunt detected by color flow Doppler.  LEFT VENTRICLE PLAX 2D LVIDd:         4.00 cm  Diastology LVIDs:         2.70 cm  LV e' medial:    4.38 cm/s LV PW:         1.20 cm  LV E/e' medial:  16.6 LV IVS:        1.20 cm  LV e' lateral:   7.62 cm/s LVOT diam:     2.10 cm  LV E/e' lateral: 9.6 LV SV:         119 LV SV Index:   68 LVOT Area:     3.46 cm  RIGHT VENTRICLE RV S prime:     20.80 cm/s TAPSE (M-mode): 2.6 cm LEFT ATRIUM             Index       RIGHT ATRIUM           Index LA diam:        3.70 cm 2.10 cm/m  RA Area:     20.90 cm LA Vol (A2C):   66.2 ml 37.57 ml/m RA Volume:   56.90 ml  32.29 ml/m LA Vol (A4C):   43.8 ml 24.86 ml/m LA Biplane Vol: 57.6 ml 32.69 ml/m  AORTIC VALVE AV Area (Vmax):    1.54 cm AV Area (Vmean):   1.71 cm AV Area (VTI):     1.78 cm AV Vmax:           293.00 cm/s AV Vmean:          201.000 cm/s AV VTI:            0.670 m AV Peak Grad:      34.3 mmHg AV Mean Grad:      18.5 mmHg LVOT Vmax:         130.00 cm/s LVOT Vmean:        99.100 cm/s LVOT VTI:          0.344 m LVOT/AV VTI ratio: 0.51  AORTA Ao Root diam: 2.70 cm MITRAL VALVE               TRICUSPID VALVE MV Area (PHT): 2.62 cm    TR Peak grad:   36.7 mmHg MV Decel Time: 289 msec    TR Vmax:        303.00 cm/s MV E velocity: 72.80 cm/s MV A velocity: 87.80 cm/s  SHUNTS MV E/A ratio:  0.83        Systemic  VTI:  0.34 m                            Systemic Diam: 2.10 cm Dani Gobble Croitoru MD Electronically signed by Sanda Klein MD Signature Date/Time: 04/19/2020/3:03:22 PM    Final    US THYROID  Result Date: 04/19/2020 CLINICAL DATA:  Incidental on CT. Right-sided thyroid nodule incidentally noted on chest CT performed 04/18/2020 EXAM: THYROID ULTRASOUND TECHNIQUE: Ultrasound examination of the thyroid gland and adjacent soft tissues was performed. COMPARISON:  Chest CT-04/18/2020 FINDINGS: Parenchymal Echotexture: Mildly heterogenous Isthmus: Normal in size measuring 0.6 cm in diameter Right lobe: Enlarged measures 6.8 x 3.7 x 3.5 cm Left lobe: Atrophic measuring 2.9 x 1.5 x 1.2 cm _________________________________________________________ Estimated total number of nodules >/=  1 cm: 1 Number of spongiform nodules >/=  2 cm not described below (TR1): 0 Number of mixed cystic and solid nodules >/= 1.5 cm not described below (TR2): 0 _________________________________________________________ Nodule # 1: Location: Right; Mid Maximum size: 5.8 cm; Other 2 dimensions: 4.1 x 3.9 cm Composition: solid/almost completely solid (2) Echogenicity: hyperechoic (1) Shape: taller-than-wide (3) Margins: ill-defined (0) Echogenic foci: none (0) ACR TI-RADS total points: 6. ACR TI-RADS risk category: TR4 (4-6 points). ACR TI-RADS recommendations: **Given size (>/= 1.5 cm) and appearance, fine needle aspiration of this moderately suspicious nodule should be considered based on TI-RADS criteria. _________________________________________________________ IMPRESSION: Solitary nodule/mass, nearly replacing the right lobe of the thyroid, correlating with the nodule seen on preceding chest CT, meets imaging criteria to recommend percutaneous sampling as indicated. The above is in keeping with the ACR TI-RADS recommendations - J Am Coll Radiol 2017;14:587-595. Electronically Signed   By: Simonne Come M.D.   On: 04/19/2020 07:38     Microbiology: Recent Results (from the past 240 hour(s))  Urine culture     Status: Abnormal   Collection Time: 04/18/20  4:18 PM   Specimen: Urine, Random  Result Value Ref Range Status   Specimen Description   Final    URINE, RANDOM Performed at [facility], [address] [place], [place] [address]    Special Requests   Final    NONE Performed at [facility], [address] [place], [place] [address]    Culture MULTIPLE SPECIES PRESENT, SUGGEST RECOLLECTION (A)  Final   Report Status 04/20/2020 FINAL  Final  SARS CORONAVIRUS 2 (TAT 6-24 HRS) Nasopharyngeal Nasopharyngeal Swab     Status: None   Collection Time: 04/18/20  4:18 PM   Specimen: Nasopharyngeal Swab  Result Value Ref Range Status   SARS Coronavirus 2 NEGATIVE NEGATIVE Final    Comment: (NOTE) SARS-CoV-2 target nucleic acids are NOT DETECTED.  The SARS-CoV-2 RNA is generally detectable in upper and lower respiratory specimens during the acute phase of infection. Negative results do not preclude SARS-CoV-2 infection, do not rule out co-infections with other pathogens, and should not be used as the sole basis for treatment or other patient management decisions. Negative results must be combined with clinical observations, patient history, and epidemiological information. The expected result is Negative.  Fact Sheet for Patients: HairSlick.no  Fact Sheet for Healthcare Providers: quierodirigir.com  This test is not yet approved or cleared by the [place] FDA and  has been authorized for detection and/or diagnosis of SARS-CoV-2 by FDA under an Emergency Use Authorization (EUA). This EUA will remain  in effect (meaning this test can be used) for the duration of the COVID-19 declaration under Se ction 564(b)(1) of the Act, 21 U.S.C. section 360bbb-3(b)(1), unless the authorization is terminated or revoked  sooner.  Performed at [facility], [address], [place], [place] [address]   Culture, blood (routine x 2)     Status: None (Preliminary result)   Collection Time: 04/19/20  3:20 PM   Specimen: BLOOD  Result Value Ref Range Status   Specimen Description   Final    BLOOD LEFT ANTECUBITAL Performed at [facility], [address], [place], [place] [address]    Special Requests   Final    BOTTLES DRAWN AEROBIC AND ANAEROBIC Blood Culture adequate volume Performed at [facility], [address], [place], [place] [address]    Culture   Final    NO  GROWTH 2 DAYS Performed at [facility], [place] [address] [place], [place] [address]    Report Status PENDING  Incomplete  Culture, blood (routine x 2)     Status: None (Preliminary result)   Collection Time: 04/19/20  3:28 PM   Specimen: BLOOD LEFT FOREARM  Result Value Ref Range Status   Specimen Description   Final    BLOOD LEFT FOREARM Performed at [place] [address], [place], [place] [address]    Special Requests   Final    BOTTLES DRAWN AEROBIC AND ANAEROBIC Blood Culture adequate volume Performed at [place] [address], [place], [place] [address]    Culture   Final    NO GROWTH 2 DAYS Performed at [place] [address] [place], [place] [address]    Report Status PENDING  Incomplete     Labs: Basic Metabolic Panel: Recent Labs  Lab 04/18/20 0950 04/18/20 1300 04/18/20 1618 04/19/20 0615 04/20/20 0527  NA  --  135  --  139 139  K  --  4.4  --  3.9 3.7  CL  --  98  --  105 108  CO2  --  27  --  25 21*  GLUCOSE  --  117*  --  103* 84  BUN  --  26*  --  24* 18  CREATININE  --  0.72  --  0.64 0.62  CALCIUM 11.7* 11.5* 10.5* 10.4* 10.1  MG  --  2.1  --   --   --    Liver Function Tests: Recent Labs  Lab 04/18/20 1300 04/19/20 0615 04/20/20 0527  AST 22 17 19   ALT 19 16 15   ALKPHOS 144* 118  115  BILITOT 0.3 0.4 0.3  PROT 7.8 6.2* 6.3*  ALBUMIN 2.9* 2.4* 2.4*   No results for input(s): LIPASE, AMYLASE in the last 168 hours. No results for input(s): AMMONIA in the last 168 hours. CBC: Recent Labs  Lab 04/18/20 1020 04/18/20 1300 04/19/20 0615 04/20/20 0527  WBC 20.3* 21.0* 14.9* 10.8*  NEUTROABS  --  16.8*  --   --   HGB 11.8 11.9* 10.0* 10.7*  HCT 35.7 37.3 31.2* 34.0*  MCV 87.2 88.2 89.7 90.2  PLT  --  528* 514* 505*   Cardiac Enzymes: No results for input(s): CKTOTAL, CKMB, CKMBINDEX, TROPONINI in the last 168 hours. BNP: BNP (last 3 results) No results for input(s): BNP in the last 8760 hours.  ProBNP (last 3 results) No results for input(s): PROBNP in the last 8760 hours.  CBG: Recent Labs  Lab 04/20/20 0811 04/20/20 1158 04/20/20 1640 04/20/20 2207 04/21/20 0806  GLUCAP 80 87 108* 185* 91       Signed:  Nita Sells MD   [facility] 04/21/2020, 10:29 AM

## 2020-04-21 NOTE — TOC Transition Note (Signed)
Transition of Care [facility]) - CM/SW Discharge Note   Patient Details  Name: Anne Rice MRN: [id number] Date of Birth: 10-06-1924  [facility] CM/SW Contact:  Bartholome Bill, RN Phone Number: 04/21/2020, 10:46 AM   Clinical Narrative:     Spoke with son Anne Rice on the phone for continued dc planning. In addition to home health services Anne Rice is interested in home Palliative services. [facility] contacted for referral.  Final next level of care: Home w Home Health Services Barriers to Discharge: Continued Medical Work up   Patient Goals and CMS Choice Patient states their goals for this hospitalization and ongoing recovery are:: to get home CMS Medicare.gov Compare Post Acute Care list provided to:: Patient Represenative (must comment) [name]) Choice offered to / list presented to : Adult Children Discharge Plan and Services   Discharge Planning Services: CM Consult Post Acute Care Choice: Home Health          DME Arranged: Dan Humphreys rolling,3-N-1 DME Agency: [employer] Date DME Agency Contacted: 04/20/20 Time DME Agency Contacted: 1200 Representative spoke with at DME Agency: Delman Kitten HH Arranged: RN HH Agency: [facility] Date [facility] Agency Contacted: 04/20/20 Time HH Agency Contacted: 1300 Representative spoke with at [facility] Agency: Kandee Keen  Social Determinants of Health (SDOH) Interventions     Readmission Risk Interventions No flowsheet data found.

## 2020-04-21 NOTE — Discharge Instructions (Signed)
Hypercalcemia Hypercalcemia is when the level of calcium in a person's blood is above normal. The body needs calcium to make bones and keep them strong. Calcium also helps the muscles, nerves, brain, and heart work the way they should. Most of the calcium in the body is in the bones. There is also some calcium in the blood. Hypercalcemia can happen when calcium comes out of the bones, or when the kidneys are not able to remove calcium from the blood. Hypercalcemia can be mild or severe. What are the causes? There are many possible causes of hypercalcemia. Common causes of this condition include:  Hyperparathyroidism. This is a condition in which the body produces too much parathyroid hormone. There are four parathyroid glands in your neck. These glands produce a chemical messenger (hormone) that helps the body absorb calcium from foods and helps your bones release calcium.  Certain kinds of cancer. Less common causes of hypercalcemia include:  Getting too much calcium or vitamin D from your diet.  Kidney failure.  Hyperthyroidism.  Severe dehydration.  Being on bed rest or being inactive for a long time.  Certain medicines.  Infections. What increases the risk? You are more likely to develop this condition if you:  Are female.  Are 60 years of age or older.  Have a family history of hypercalcemia. What are the signs or symptoms? Mild hypercalcemia that starts slowly may not cause symptoms. Severe, sudden hypercalcemia is more likely to cause symptoms, such as:  Being more thirsty than usual.  Needing to urinate more often than usual.  Abdominal pain.  Nausea and vomiting.  Constipation.  Muscle pain, twitching, or weakness.  Feeling very tired. How is this diagnosed?  Hypercalcemia is usually diagnosed with a blood test. You may also have tests to help determine what is causing this condition, such as imaging tests and more blood tests. How is this  treated? Treatment for hypercalcemia depends on the cause. Treatment may include:  Receiving fluids through an IV.  Medicines that: ? Keep calcium levels steady after receiving fluids (loop diuretics). ? Keep calcium in your bones (bisphosphonates). ? Lower the calcium level in your blood.  Surgery to remove overactive parathyroid glands.  A procedure that filters your blood to correct calcium levels (hemodialysis). Follow these instructions at home:   Take over-the-counter and prescription medicines only as told by your health care provider.  Follow instructions from your health care provider about eating or drinking restrictions.  Drink enough fluid to keep your urine pale yellow.  Stay active. Weight-bearing exercise helps to keep calcium in your bones. Follow instructions from your health care provider about what type and level of exercise is safe for you.  Keep all follow-up visits as told by your health care provider. This is important. Contact a health care provider if you have:  A fever.  A heartbeat that is irregular or very fast.  Changes in mood, memory, or personality. Get help right away if you:  Have severe abdominal pain.  Have chest pain.  Have trouble breathing.  Become very confused and sleepy.  Lose consciousness. Summary  Hypercalcemia is when the level of calcium in a person's blood is above normal. The body needs calcium to make bones and keep them strong. Calcium also helps the muscles, nerves, brain, and heart work the way they should.  There are many possible causes of hypercalcemia, and treatment depends on the cause.  Take over-the-counter and prescription medicines only as told by your health care   provider.  Follow instructions from your health care provider about eating or drinking restrictions. This information is not intended to replace advice given to you by your health care provider. Make sure you discuss any questions you have with  your health care provider. Document Revised: 05/06/2018 Document Reviewed: 01/13/2018 Elsevier Patient Education  2020 [employer]vier Inc.  

## 2020-04-22 ENCOUNTER — Encounter (HOSPITAL_COMMUNITY): Payer: Self-pay | Admitting: Gastroenterology

## 2020-04-24 LAB — CULTURE, BLOOD (ROUTINE X 2)
Culture: NO GROWTH
Culture: NO GROWTH
Special Requests: ADEQUATE
Special Requests: ADEQUATE

## 2020-04-25 ENCOUNTER — Other Ambulatory Visit: Payer: Self-pay | Admitting: *Deleted

## 2020-04-25 ENCOUNTER — Telehealth: Payer: Self-pay | Admitting: *Deleted

## 2020-04-25 LAB — PTH-RELATED PEPTIDE: PTH-related peptide: <2 pmol/L

## 2020-04-25 NOTE — Telephone Encounter (Signed)
Left message for hospital follow up

## 2020-04-25 NOTE — Patient Outreach (Signed)
[facility]) Care Management  04/25/2020  DALEEN STEINHAUS 25-Apr-1924 [id number]   EMMI-GENERAL DISCHARGE RED ON EMMI ALERT Day #1 Date:04/23/2020 Red Alert Reason: NO F/U, UNFILLED PRESCRIPTIONS, UNABLE TO FILL PRESCRIPTIONS.  OUTREACH #1 RN attempted outreach however unsuccessful. RN able to leave a HIPAA approved voice message requesting a call back.  Will rescheduled another outreach over the next 4 days and send outreach letter.   Elliot Cousin, RN Care Management Coordinator [facility] Main Office [phone]

## 2020-04-27 ENCOUNTER — Other Ambulatory Visit: Payer: Self-pay | Admitting: *Deleted

## 2020-04-27 NOTE — Telephone Encounter (Signed)
Pt son randy is returning call to sch his mother for post hospital follow up

## 2020-04-27 NOTE — Telephone Encounter (Signed)
Please assist in scheduling pt for HOS F.u Appt

## 2020-04-27 NOTE — Patient Outreach (Signed)
[facility]) Care Management  04/27/2020  Anne Rice Nov 15, 1924 [id number]   EMMI-GENERAL DISCHARGE-RESOLVED RED ON EMMI ALERT Day #1 &4 Date: 1/1/ & 1/4 Red Alert Reason: UNFILLED PRESCRIPTIONS, UNABLE TO FILL AND F/U APPOINTMENT  OUTREACH #2 RN  Spoke with pt who permitted RN to speak with her son Harvie Heck). Discussed the approved EMMI. Verified pt is awaiting a liquid formula on some of her medications due ot inability to swallow pills. States the pharmacy will obtain and provided the pending medication this week. Encouraged caregiver if necessary to consider another pharmacy just to fill the liquid medication pending.Other discussions around the pending follow up appointment. Informed the pt's caregiver that Primary at [facility] has outreached to the pt on 1/3 however was unsuccessful with the call. Encouraged caregiver to contact the provider's office for a hospital follow up appointment. Caregiver receptive and indicated he would call. No other issues or needs at this time.   Will close and provided contact for [facility] services if needed in the future. Pt's caregiver very appreciative as the call ended.  Elliot Cousin, RN Care Management Coordinator [facility] Main Office [phone]

## 2020-04-27 NOTE — Telephone Encounter (Signed)
Called pts son and sch appt for 04/29/20

## 2020-04-27 NOTE — Telephone Encounter (Signed)
Noted  

## 2020-04-28 DIAGNOSIS — K449 Diaphragmatic hernia without obstruction or gangrene: Secondary | ICD-10-CM | POA: Diagnosis not present

## 2020-04-28 DIAGNOSIS — K2270 Barrett's esophagus without dysplasia: Secondary | ICD-10-CM | POA: Diagnosis not present

## 2020-04-28 DIAGNOSIS — D72829 Elevated white blood cell count, unspecified: Secondary | ICD-10-CM | POA: Diagnosis not present

## 2020-04-29 ENCOUNTER — Ambulatory Visit: Payer: Self-pay | Admitting: *Deleted

## 2020-04-29 ENCOUNTER — Encounter: Payer: Self-pay | Admitting: Family Medicine

## 2020-04-29 ENCOUNTER — Other Ambulatory Visit: Payer: Self-pay

## 2020-04-29 ENCOUNTER — Ambulatory Visit (INDEPENDENT_AMBULATORY_CARE_PROVIDER_SITE_OTHER): Payer: Medicare Other | Admitting: Family Medicine

## 2020-04-29 VITALS — BP 122/77 | HR 89 | Temp 97.3°F | Ht 60.0 in | Wt 160.0 lb

## 2020-04-29 DIAGNOSIS — K209 Esophagitis, unspecified without bleeding: Secondary | ICD-10-CM | POA: Diagnosis not present

## 2020-04-29 DIAGNOSIS — D72829 Elevated white blood cell count, unspecified: Secondary | ICD-10-CM | POA: Diagnosis not present

## 2020-04-29 DIAGNOSIS — E079 Disorder of thyroid, unspecified: Secondary | ICD-10-CM | POA: Diagnosis not present

## 2020-04-29 DIAGNOSIS — K22719 Barrett's esophagus with dysplasia, unspecified: Secondary | ICD-10-CM

## 2020-04-29 DIAGNOSIS — F0390 Unspecified dementia without behavioral disturbance: Secondary | ICD-10-CM

## 2020-04-29 DIAGNOSIS — I480 Paroxysmal atrial fibrillation: Secondary | ICD-10-CM | POA: Diagnosis not present

## 2020-04-29 MED ORDER — LANSOPRAZOLE 3 MG/ML SUSP: 15.0000 mg | Freq: Two times a day (BID) | ORAL | 2 refills | Status: DC

## 2020-04-29 NOTE — Patient Instructions (Addendum)
  Lansoprazole twice per day - paper rx may need to be given to pharmacy to fill.  Continue amlodipine once per day.  Tylenol if needed for aches/pains.  Continue regular diet for now, ensure if decreased appetite or intake if needed.  Physical therapist can assess for assistive devices such as a wheelchair. Let me know if there are questions.  I will place a referral for palliative care to discuss goals of care moving forward.  Good meeting you today and let me know if there are questions.   Return to the clinic or go to the nearest emergency room if any of your symptoms worsen or new symptoms occur.    If you have lab work done today you will be contacted with your lab results within the next 2 weeks.  If you have not heard from [place] then please contact us. The fastest way to get your results is to register for My Chart.   IF you received an x-ray today, you will receive an invoice from [facility]. Please contact [facility] at [phone] with questions or concerns regarding your invoice.   IF you received labwork today, you will receive an invoice from [facility]. Please contact [facility] at [phone] with questions or concerns regarding your invoice.   Our billing staff will not be able to assist you with questions regarding bills from these companies.  You will be contacted with the lab results as soon as they are available. The fastest way to get your results is to activate your My Chart account. Instructions are located on the last page of this paperwork. If you have not heard from [place] regarding the results in 2 weeks, please contact this office.

## 2020-04-29 NOTE — Progress Notes (Signed)
Subjective:  Patient ID: Anne Rice, female    DOB: 01-10-1925  Age: 87 y.o. MRN: [id number]  CC:  Chief Complaint  Patient presents with  . Hospitalization Follow-up    PT went to the hospital on 04/18/2020. Pt went to hospital due to pain pt was advised to go to the hospital after an appt. Since leaving the hospital the pt's son reports she seems to be eatting more and being a little more active, but states he thinks she needs a little more activity in her life. Pt's son seems concerned about recent blood test the showed elevated white blood cells.    HPI Anne Rice presents for  Hospital follow-up.  New patient to me, previously followed by Dr. Pamella Pert.  Presents with son Anne Rice and grandson Anne Rice who are providing some history.  TIA in September, aborted fall in November.  [facility] nurse came by yesterday.  Pharmacy contact today - liquid amlodipine on order - having to crush amlodipine.  Lansoprazole - once per day, now increasing to BID. Unknown status of liquid formulation.   Seen here  December 27 by Dr. Mitchel Honour with declining mental status, fatigue, worsening leg edema, decreased appetite and possible dehydration.  Difficulty with pain control for her low back pain at home.  Leukocytosis, hypercalcemia.  Suspected dehydration with possible sepsis, hypercalcemia.  Referred to emergency room.  Admitted December 27 through December 30. Noted to have hypercalcemia, calcium 11.5, WBC 21.  UA was negative.  Blood cultures negative.  Work-up revealed abnormal CT esophagus with sliding-type hiatal hernia and irregular appearance.  GI consulted, EGD indicated severe esophagitis with deep ulcers but no stricturing or bleeding.  Additionally found to have paroxysmal A. fib and thyroid mass  Leukocytosis Initial WBC 21.0 in December 27.  Improved during hospitalization to 10.8 on December 29.  Urine culture with multiple species, blood culture was negative.  CT chest without sign of  infection.  CT abdomen, pelvis without significant abnormalities noted other than hiatal hernia and thyroid mass.  Coronary artery calcifications noted.  Esophagitis With Barrett's esophagus, EGD as above.  Treated with lansoprazole liquid twice daily, deciding on goals of care and possible hospice at home.  Aspirin, ibuprofen was discontinued.  Outpatient CBC, CMP recommended in 1 week from hospital discharge. Prior diclofenac for shoulder pain, off since hospitalization.  No c/o shoulder pain.  Appetite improved. Has ensure if needed, but eating well.   New onset paroxysmal atrial fibrillation  Patient had a fall recently and due to concern of hemorrhage and bleeding anticoagulation was deferred.  Amlodipine was on hold during hospitalization with as needed metoprolol.  Plan for resumption of amlodipine on discharge but as a liquid.  Telephone notes reviewed with some difficulty obtaining the liquid formulations from the pharmacy.  Thyroid mass 4.4 cm thyroid mass noted, no further work-up as possible palliative approach. Further workup declined at this time. Working on esophagitis for now. DNR.   Dementia From hospital notes reportedly symptoms have worsened since September.  Plan for hospice discussion, palliative care discussion. She recognizes people. Son feels like she is at baseline. Some fatigue since leaving hospital. Has toilet chair, walker - some assistance.  Home health nurse about once per week. Lives with son, some help form Grandson. [facility] PT planned.  Plan for home care. Would like to meet with palliative care.   Diabetes: Diet controlled, liberalize and allow comfort diet recommended from hospital discharge.    Lab Results  Component Value Date  HGBA1C 6.2 (H) 04/18/2020   HGBA1C 5.7 (A) 01/14/2020   HGBA1C 6.0 (H) 12/01/2018       History Patient Active Problem List   Diagnosis Date Noted  . Hypercalcemia 04/18/2020  . Murmur 12/06/2015  . Type 2 diabetes  mellitus ([place]) 01/11/2014  . Hyperlipidemia LDL goal <100 01/11/2014  . Obesity, unspecified 01/11/2014  . Basal cell carcinoma of skin 09/04/2013  . HTN (hypertension) 05/18/2013  . Dementia ([place]) 05/18/2013   Past Medical History:  Diagnosis Date  . Depression   . Murmur 12/06/2015   Past Surgical History:  Procedure Laterality Date  . ABDOMINAL HYSTERECTOMY    . APPENDECTOMY    . BIOPSY  04/19/2020   Procedure: BIOPSY;  Surgeon: Ronald Lobo, MD;  Location: [facility] ENDOSCOPY;  Service: Endoscopy;;  . ESOPHAGOGASTRODUODENOSCOPY N/A 04/19/2020   Procedure: ESOPHAGOGASTRODUODENOSCOPY (EGD);  Surgeon: Ronald Lobo, MD;  Location: [facility];  Service: Endoscopy;  Laterality: N/A;   No Known Allergies Prior to Admission medications   Medication Sig Start Date End Date Taking? Authorizing Provider  amLODIPine (KATERZIA) 1 mg/mL SUSP oral suspension Take 2.5 mLs (2.5 mg total) by mouth daily. 04/21/20  Yes Nita Sells, MD  feeding supplement (ENSURE ENLIVE / ENSURE PLUS) LIQD Take 237 mLs by mouth 2 (two) times daily between meals. 04/21/20  Yes Nita Sells, MD  lansoprazole (PREVACID) 3 mg/ml SUSP oral suspension Place 5 mLs (15 mg total) into feeding tube 2 (two) times daily. 04/21/20  Yes Nita Sells, MD  melatonin 3 MG TABS tablet Take 2 tablets (6 mg total) by mouth at bedtime as needed (sleep). 04/21/20  Yes Nita Sells, MD  Multiple Vitamin (MULTIVITAMIN WITH MINERALS) TABS tablet Take 1 tablet by mouth daily.   Yes [provider]  pantoprazole (PROTONIX) 40 MG tablet Take 40 mg by mouth daily. 04/21/20  Yes [provider]   Social History   Socioeconomic History  . Marital status: Unknown    Spouse name: Not on file  . Number of children: Not on file  . Years of education: Not on file  . Highest education level: Not on file  Occupational History  . Not on file  Tobacco Use  . Smoking status: Former Smoker    Quit  date: 04/24/1967    Years since quitting: 53.0  . Smokeless tobacco: Never Used  Substance and Sexual Activity  . Alcohol use: No  . Drug use: No  . Sexual activity: Never  Other Topics Concern  . Not on file  Social History Narrative  . Not on file   Social Determinants of Health   Financial Resource Strain: Not on file  Food Insecurity: Not on file  Transportation Needs: Not on file  Physical Activity: Not on file  Stress: Not on file  Social Connections: Not on file  Intimate Partner Violence: Not on file    Review of Systems   Objective:   Vitals:   04/29/20 1118  BP: 122/77  Pulse: 89  Temp: (!) 97.3 F (36.3 C)  TempSrc: Temporal  SpO2: 96%  Weight: 160 lb (72.6 kg)  Height: 5' (1.524 m)     Physical Exam Vitals reviewed.  Constitutional:      General: She is not in acute distress.    Appearance: She is well-developed, overweight and well-nourished. She is not toxic-appearing.  HENT:     Head: Normocephalic and atraumatic.  Eyes:     Extraocular Movements: EOM normal.     Conjunctiva/sclera: Conjunctivae normal.  Pupils: Pupils are equal, round, and reactive to light.  Neck:     Vascular: No carotid bruit.  Cardiovascular:     Rate and Rhythm: Normal rate and regular rhythm.     Pulses: Intact distal pulses.     Heart sounds: Normal heart sounds.     Comments: Reg rhythm Pulmonary:     Effort: Pulmonary effort is normal.     Breath sounds: Normal breath sounds.  Abdominal:     Palpations: Abdomen is soft. There is no pulsatile mass.     Tenderness: There is no abdominal tenderness.  Musculoskeletal:     Right lower leg: Edema (trace pedal edema bilat, no rash/wound. ) present.     Left lower leg: Edema present.  Skin:    General: Skin is warm and dry.  Neurological:     Mental Status: She is lethargic.  Psychiatric:        Mood and Affect: Mood and affect normal.        Behavior: Behavior normal.     Comments: Responds with  questioning.      50 minutes spent during visit, greater than 50% counseling and assimilation of information, chart review, and discussion of plan.    Assessment & Plan:  CADIE MEISS is a 87 y.o. female . Dementia without behavioral disturbance, unspecified dementia type ([place]) - Plan: Amb Referral to Palliative Care  -Potentially may have had progression past few months.  Discussion with son, grandson in office today, will have palliative care evaluate for goals of care.  -Health care nurse as well as reported PT eval pending.  Leukocytosis, unspecified type  -Improving during hospitalization, recheck CBC.  No apparent sign of infection at this time  Hypercalcemia - Plan: Comprehensive metabolic panel  -Recheck CMP.  Continue regular diet at this time.  Barrett's esophagus with dysplasia - Plan: CBC Acute esophagitis  -Avoidance of NSAIDs, continue proton pump inhibitor, twice daily dosing planned and liquid formulation written.  PAF (paroxysmal atrial fibrillation) (HCC)  -Sinus rhythm in office today.  With current status and risk for falls, bleeding risk, decided against anticoagulation at this time.    Thyroid mass  -Decided against further evaluation at this time pending goals of care discussion.    Meds ordered this encounter  Medications  . lansoprazole (PREVACID) 3 mg/ml SUSP oral suspension    Sig: Take 5 mLs (15 mg total) by mouth 2 (two) times daily.    Dispense:  150 mL    Refill:  2   Patient Instructions    Lansoprazole twice per day - paper rx may need to be given to pharmacy to fill.  Continue amlodipine once per day.  Tylenol if needed for aches/pains.  Continue regular diet for now, ensure if decreased appetite or intake if needed.  Physical therapist can assess for assistive devices such as a wheelchair. Let me know if there are questions.  I will place a referral for palliative care to discuss goals of care moving forward.  Good meeting you today  and let me know if there are questions.   Return to the clinic or go to the nearest emergency room if any of your symptoms worsen or new symptoms occur.    If you have lab work done today you will be contacted with your lab results within the next 2 weeks.  If you have not heard from [place] then please contact us. The fastest way to get your results is to register for My  Chart.   IF you received an x-ray today, you will receive an invoice from [facility]. Please contact [facility] at [phone] with questions or concerns regarding your invoice.   IF you received labwork today, you will receive an invoice from [place]. Please contact [facility] at [phone] with questions or concerns regarding your invoice.   Our billing staff will not be able to assist you with questions regarding bills from these companies.  You will be contacted with the lab results as soon as they are available. The fastest way to get your results is to activate your My Chart account. Instructions are located on the last page of this paperwork. If you have not heard from [place] regarding the results in 2 weeks, please contact this office.         Signed, Merri Ray, MD [facility]

## 2020-04-30 LAB — CBC
Hematocrit: 34.6 % (ref 34.0–46.6)
Hemoglobin: 11.2 g/dL (ref 11.1–15.9)
MCH: 28 pg (ref 26.6–33.0)
MCHC: 32.4 g/dL (ref 31.5–35.7)
MCV: 87 fL (ref 79–97)
Platelets: 407 10*3/uL (ref 150–450)
RBC: 4 x10E6/uL (ref 3.77–5.28)
RDW: 13.3 % (ref 11.7–15.4)
WBC: 10.5 10*3/uL (ref 3.4–10.8)

## 2020-04-30 LAB — COMPREHENSIVE METABOLIC PANEL
ALT: 20 IU/L (ref 0–32)
AST: 27 IU/L (ref 0–40)
Albumin/Globulin Ratio: 1.1 — ABNORMAL LOW (ref 1.2–2.2)
Albumin: 3.3 g/dL — ABNORMAL LOW (ref 3.5–4.6)
Alkaline Phosphatase: 162 IU/L — ABNORMAL HIGH (ref 44–121)
BUN/Creatinine Ratio: 22 (ref 12–28)
BUN: 13 mg/dL (ref 10–36)
Bilirubin Total: <0.2 mg/dL (ref 0.0–1.2)
CO2: 26 mmol/L (ref 20–29)
Calcium: 10 mg/dL (ref 8.7–10.3)
Chloride: 102 mmol/L (ref 96–106)
Creatinine, Ser: 0.6 mg/dL (ref 0.57–1.00)
GFR calc Af Amer: 91 mL/min/{1.73_m2} (ref >59)
GFR calc non Af Amer: 79 mL/min/{1.73_m2} (ref >59)
Globulin, Total: 3 g/dL (ref 1.5–4.5)
Glucose: 115 mg/dL — ABNORMAL HIGH (ref 65–99)
Potassium: 4.5 mmol/L (ref 3.5–5.2)
Sodium: 138 mmol/L (ref 134–144)
Total Protein: 6.3 g/dL (ref 6.0–8.5)

## 2020-05-02 ENCOUNTER — Telehealth: Payer: Self-pay

## 2020-05-02 DIAGNOSIS — K449 Diaphragmatic hernia without obstruction or gangrene: Secondary | ICD-10-CM | POA: Diagnosis not present

## 2020-05-02 DIAGNOSIS — K2270 Barrett's esophagus without dysplasia: Secondary | ICD-10-CM | POA: Diagnosis not present

## 2020-05-02 DIAGNOSIS — D72829 Elevated white blood cell count, unspecified: Secondary | ICD-10-CM | POA: Diagnosis not present

## 2020-05-02 NOTE — Telephone Encounter (Signed)
Spoke with patient's son [name] and scheduled an telephone Palliative Consult for 05/18/20 @ 10:30AM. [name] states he had problems in the past with zoom and would whether do visit via telephonic.   Consent obtained; updated Outlook/Netsmart/Team List and Epic.

## 2020-05-04 DIAGNOSIS — K2270 Barrett's esophagus without dysplasia: Secondary | ICD-10-CM | POA: Diagnosis not present

## 2020-05-04 DIAGNOSIS — K449 Diaphragmatic hernia without obstruction or gangrene: Secondary | ICD-10-CM | POA: Diagnosis not present

## 2020-05-04 DIAGNOSIS — D72829 Elevated white blood cell count, unspecified: Secondary | ICD-10-CM | POA: Diagnosis not present

## 2020-05-05 DIAGNOSIS — K449 Diaphragmatic hernia without obstruction or gangrene: Secondary | ICD-10-CM | POA: Diagnosis not present

## 2020-05-05 DIAGNOSIS — K2270 Barrett's esophagus without dysplasia: Secondary | ICD-10-CM | POA: Diagnosis not present

## 2020-05-05 DIAGNOSIS — D72829 Elevated white blood cell count, unspecified: Secondary | ICD-10-CM | POA: Diagnosis not present

## 2020-05-11 ENCOUNTER — Telehealth: Payer: Self-pay | Admitting: *Deleted

## 2020-05-11 NOTE — Telephone Encounter (Signed)
Faxed signed order to [facility]. Confirmation page 12:22 pm.

## 2020-05-12 DIAGNOSIS — K2270 Barrett's esophagus without dysplasia: Secondary | ICD-10-CM | POA: Diagnosis not present

## 2020-05-12 DIAGNOSIS — K449 Diaphragmatic hernia without obstruction or gangrene: Secondary | ICD-10-CM | POA: Diagnosis not present

## 2020-05-12 DIAGNOSIS — D72829 Elevated white blood cell count, unspecified: Secondary | ICD-10-CM | POA: Diagnosis not present

## 2020-05-13 ENCOUNTER — Encounter: Payer: Self-pay | Admitting: Family Medicine

## 2020-05-13 MED ORDER — PANTOPRAZOLE SODIUM 40 MG PO TBEC: 40.0000 mg | DELAYED_RELEASE_TABLET | Freq: Two times a day (BID) | ORAL | 0 refills | Status: DC

## 2020-05-17 DIAGNOSIS — K2270 Barrett's esophagus without dysplasia: Secondary | ICD-10-CM | POA: Diagnosis not present

## 2020-05-17 DIAGNOSIS — K449 Diaphragmatic hernia without obstruction or gangrene: Secondary | ICD-10-CM | POA: Diagnosis not present

## 2020-05-17 DIAGNOSIS — D72829 Elevated white blood cell count, unspecified: Secondary | ICD-10-CM | POA: Diagnosis not present

## 2020-05-18 ENCOUNTER — Other Ambulatory Visit: Payer: Medicare Other | Admitting: Nurse Practitioner

## 2020-05-18 ENCOUNTER — Other Ambulatory Visit: Payer: Self-pay

## 2020-05-18 DIAGNOSIS — Z515 Encounter for palliative care: Secondary | ICD-10-CM | POA: Diagnosis not present

## 2020-05-18 DIAGNOSIS — R531 Weakness: Secondary | ICD-10-CM

## 2020-05-18 NOTE — Progress Notes (Signed)
[facility] Consult Note Telephone: [phone]  Fax: [phone]  PATIENT NAME: Anne Rice [address] [place] [address] [id number] (home)  DOB: December 04, 1924 MRN: [id number]  PRIMARY CARE PROVIDER:    Wendie Agreste, MD  REFERRING PROVIDER:   Wendie Agreste, MD [address] [place],  [place] [address] [phone]  RESPONSIBLE PARTY:   Extended Emergency Contact Information Primary Emergency Contact: Anne Rice,Anne Rice Address: [address]          [place], [place] [address] Johnnette Litter of [place] Phone: [phone] Mobile Phone: [phone] Relation: Son  Due to the COVID-19, this visit was done via telephone from my office and it was initiated and consent by this patient and her son Anne Rice. HIPPA policies of confidentially were discussed.  ASSESSMENT AND RECOMMENDATIONS:   Advance Care Planning: Today's visit consisted of building trust and discussions on Palliative care medicine as a specialized medical care for people living with serious illness, aimed at facilitating better quality of life through symptoms relief, assisting with advance care plan and establishing goals of care. Son expressed appreciation for education provided on Palliative care and how it differs from Hospice service. Palliative care will continue to provide support to patient, family and the medical team. Goal of care: Patient goal of care is comfort. Son does not desire aggressive treatment, he however said minor surgery may be considered. Directives: Son report that patient does not want to be resuscitated in the event of cardiac or respiratory arrest. Report patient has a living will. DNR form signed for patient, form will be mailed to patient at address on chart. Copy uploaded to [facility] EMR.   Symptom Management:  Weakness: Son report patient doing poorly since TIA in October of 2021. Son report patient lacks self  motivation. Son report patient receives home health nursing once a week, report patient is being evaluated for ongoing physical therapy. Son report chronic pain in left shoulder currently managed on Tylenol 1000mg  1-2 times a day as needed. Encouraged increasing activity as tolerated while maintaining safety.  Follow up Palliative Care Visit: Palliative care will continue to follow for goals of care clarification and symptom management. Return on 06/21/2020 to review result of biopsy and review of goal of care.  Family /Caregiver/Community Supports: Patient lived with son Anne Rice, who is her main caregiver.  Cognitive / Functional decline: Son report decline in patient's function since September after TIA. Report patient needs assistance bathing, however able to dress self, able to wash dishes, occassionally walks with walker. Son report right leg weaker than left. No report of falls, report a recent near miss fall.  I spent 48 minutes providing this consultation, time includes time spent on telephone with with patient and son, chart review, and documentation. More than 50% of the time in this consultation was spent counseling and coordinating communication.   CHIEF COMPLAINT: Initial palliative care consultation  History obtained from review of EMR, and discussion with patient and son. Records reviewed and summarized bellow.  HISTORY OF PRESENT ILLNESS:  Anne Rice is a 87 y.o. year old female with multiple medical problems including Dementia, HTN, diet controlled diabetes, hiatal hernia, barrette esophagus with dysplasia, hx of TIA 01/2020. Patient has a new finding of a Thyroid mass, pending further work up, son not wanting to make decision until result of diagnostics. Palliative Care was asked to follow this patient by consultation request of Wendie Agreste, MD to help address advance care  planning and goals of care. This is an initial visit.  CODE STATUS: DNR  PPS: 50%  HOSPICE  ELIGIBILITY/DIAGNOSIS: TBD  Review Of Systems:  No physical exam due to tele-visit. Current and past weights: per son weight is 160lbs down from baseline weight of 214lbs Cardiovascular: no chest pain reported, no report of palpitation Pulmonary: no report of cough GI:  appetite fair, no report of constipation GU: denies dysuria Neurological: report mild confsion  PAST MEDICAL HISTORY:  Past Medical History:  Diagnosis Date  . Depression   . Murmur 12/06/2015    SOCIAL HX:  Social History   Tobacco Use  . Smoking status: Former Smoker    Quit date: 04/24/1967    Years since quitting: 53.1  . Smokeless tobacco: Never Used  Substance Use Topics  . Alcohol use: No   FAMILY HX:  Family History  Problem Relation Age of Onset  . Parkinson's disease Father   . Dementia Brother     ALLERGIES: No Known Allergies   PERTINENT MEDICATIONS:  Outpatient Encounter Medications as of 05/18/2020  Medication Sig  . amLODIPine (KATERZIA) 1 mg/mL SUSP oral suspension Take 2.5 mLs (2.5 mg total) by mouth daily.  . feeding supplement (ENSURE ENLIVE / ENSURE PLUS) LIQD Take 237 mLs by mouth 2 (two) times daily between meals.  . lansoprazole (PREVACID) 3 mg/ml SUSP oral suspension Take 5 mLs (15 mg total) by mouth 2 (two) times daily.  . melatonin 3 MG TABS tablet Take 2 tablets (6 mg total) by mouth at bedtime as needed (sleep).  . Multiple Vitamin (MULTIVITAMIN WITH MINERALS) TABS tablet Take 1 tablet by mouth daily.  . pantoprazole (PROTONIX) 40 MG tablet Take 1 tablet (40 mg total) by mouth 2 (two) times daily.   No facility-administered encounter medications on file as of 05/18/2020.    Thank you for the opportunity to participate in the care of Ms. Anne Rice. The palliative care team will continue to follow. Please call our office at [phone] if we can be of additional assistance.   Jari Favre, DNP, AGPCNP-BC

## 2020-05-20 DIAGNOSIS — K2270 Barrett's esophagus without dysplasia: Secondary | ICD-10-CM | POA: Diagnosis not present

## 2020-05-20 DIAGNOSIS — D72829 Elevated white blood cell count, unspecified: Secondary | ICD-10-CM | POA: Diagnosis not present

## 2020-05-20 DIAGNOSIS — K449 Diaphragmatic hernia without obstruction or gangrene: Secondary | ICD-10-CM | POA: Diagnosis not present

## 2020-05-23 DIAGNOSIS — K2270 Barrett's esophagus without dysplasia: Secondary | ICD-10-CM | POA: Diagnosis not present

## 2020-05-23 DIAGNOSIS — D72829 Elevated white blood cell count, unspecified: Secondary | ICD-10-CM | POA: Diagnosis not present

## 2020-05-23 DIAGNOSIS — K449 Diaphragmatic hernia without obstruction or gangrene: Secondary | ICD-10-CM | POA: Diagnosis not present

## 2020-05-24 ENCOUNTER — Telehealth: Payer: Self-pay | Admitting: *Deleted

## 2020-05-24 NOTE — Telephone Encounter (Signed)
Faxed signed orders to [facility]: Bettina Gavia. Confirmation 5:35 pm.

## 2020-05-25 ENCOUNTER — Encounter: Payer: Self-pay | Admitting: Family Medicine

## 2020-05-25 ENCOUNTER — Ambulatory Visit (INDEPENDENT_AMBULATORY_CARE_PROVIDER_SITE_OTHER): Payer: Medicare Other | Admitting: Family Medicine

## 2020-05-25 ENCOUNTER — Other Ambulatory Visit: Payer: Self-pay

## 2020-05-25 VITALS — BP 138/82 | HR 87 | Temp 98.4°F | Ht 60.0 in | Wt 173.0 lb

## 2020-05-25 DIAGNOSIS — I480 Paroxysmal atrial fibrillation: Secondary | ICD-10-CM | POA: Diagnosis not present

## 2020-05-25 DIAGNOSIS — E079 Disorder of thyroid, unspecified: Secondary | ICD-10-CM

## 2020-05-25 DIAGNOSIS — F0390 Unspecified dementia without behavioral disturbance: Secondary | ICD-10-CM

## 2020-05-25 DIAGNOSIS — K22719 Barrett's esophagus with dysplasia, unspecified: Secondary | ICD-10-CM | POA: Diagnosis not present

## 2020-05-25 NOTE — Progress Notes (Signed)
Subjective:  Patient ID: Anne Rice, female    DOB: Jul 29, 1924  Age: 87 y.o. MRN: [id number]  CC:  Chief Complaint  Patient presents with  . Follow-up    Continued follow-up from hospital admittion on  04/18/2020 for Esophagitis. Pt's son states the pt has been improving since last OV. He has a question about the 2 medication for the pt's stomach. He isn't sure if she needs both or not. Pt's son is requesting a handy cap form due to the pt not being able to walk more than 200 feet with out resting. Pt would like to ask the providers thoughts about an apple watch for the pt.     HPI Anne Rice presents for   Here with son.   Esophagitis, Barrett's esophagus See last visit January 7.  Previous hospitalization.  Plan for lansoprazole liquid twice daily, goals of care and possible hospice at home discussed at previous visit.  Aspirin, ibuprofen was discontinued.  Appetite was improved last visit and had Ensure if needed. Diet controlled diabetes.  Albumin was low, but improving at last visit, calcium has normalized.  Previous leukocytosis and anemia have normalized. Still taking protonix BID. Unable to fill liquid prevacid. Has been taking protonix BID.  Sitting up after eating - eating ok. Elevating HOB No recent nsaids, only tylenol.   Dementia Also discussed last visit with worsening symptoms in September of last year - TIA last year.  There was discussion regarding hospice, palliative care discussion.  Son with her last visit, reported she was at baseline.  Some fatigue since leaving hospital.   Had toilet chair, walker at home.  Home health nurse weekly at that time.  Living with son with some help from grandson.  Home care/PT planned - 2 times per week. Has been doing well and son heloing with exercises on off days.  Handicap placard needed - difficulty walking long distances. Son feels like less problems with dementia at home. Back to baseline prior to TIA in September.    Thyroid mass 4.4cm mass noted at hospitalization in December - 04/19/20. Thyroid mass was discussed last visit, noted in hospital, decided against further evaluation pending goals of care discussion. Discussed need for percutaneous sampling, and recommended discussion of evaluation and possible surgery with surgeon. Would like to meet with surgeon to discuss.   Palliative care note reviewed from January 26 Comfort as goal of care, minor surgery, but no aggressive treatment. DNR Plan for follow up in March.   New onset atrial fibrillation/paroxysmal A. Fib.  With current health issues and risk of falls, bleeding risk anticoagulation was not started.  Sinus rhythm in office last visit.  No palpitations. No chest pain, no near syncope/syncope.   Crushing amlodipine - taking daily.   History Patient Active Problem List   Diagnosis Date Noted  . Hypercalcemia 04/18/2020  . Murmur 12/06/2015  . Type 2 diabetes mellitus ([place]) 01/11/2014  . Hyperlipidemia LDL goal <100 01/11/2014  . Obesity, unspecified 01/11/2014  . Basal cell carcinoma of skin 09/04/2013  . HTN (hypertension) 05/18/2013  . Dementia ([place]) 05/18/2013   Past Medical History:  Diagnosis Date  . Depression   . Murmur 12/06/2015   Past Surgical History:  Procedure Laterality Date  . ABDOMINAL HYSTERECTOMY    . APPENDECTOMY    . BIOPSY  04/19/2020   Procedure: BIOPSY;  Surgeon: Ronald Lobo, MD;  Location: [facility] ENDOSCOPY;  Service: Endoscopy;;  . ESOPHAGOGASTRODUODENOSCOPY N/A 04/19/2020   Procedure: ESOPHAGOGASTRODUODENOSCOPY (EGD);  Surgeon: Ronald Lobo, MD;  Location: [facility];  Service: Endoscopy;  Laterality: N/A;   No Known Allergies Prior to Admission medications   Medication Sig Start Date End Date Taking? Authorizing Provider  amLODIPine (KATERZIA) 1 mg/mL SUSP oral suspension Take 2.5 mLs (2.5 mg total) by mouth daily. 04/21/20  Yes Nita Sells, MD  feeding supplement (ENSURE ENLIVE /  ENSURE PLUS) LIQD Take 237 mLs by mouth 2 (two) times daily between meals. 04/21/20  Yes Nita Sells, MD  lansoprazole (PREVACID) 3 mg/ml SUSP oral suspension Take 5 mLs (15 mg total) by mouth 2 (two) times daily. 04/29/20  Yes Wendie Agreste, MD  melatonin 3 MG TABS tablet Take 2 tablets (6 mg total) by mouth at bedtime as needed (sleep). 04/21/20  Yes Nita Sells, MD  Multiple Vitamin (MULTIVITAMIN WITH MINERALS) TABS tablet Take 1 tablet by mouth daily.   Yes [provider]  pantoprazole (PROTONIX) 40 MG tablet Take 1 tablet (40 mg total) by mouth 2 (two) times daily. Patient not taking: Reported on 05/25/2020 05/13/20   Wendie Agreste, MD   Social History   Socioeconomic History  . Marital status: Unknown    Spouse name: Not on file  . Number of children: Not on file  . Years of education: Not on file  . Highest education level: Not on file  Occupational History  . Not on file  Tobacco Use  . Smoking status: Former Smoker    Quit date: 04/24/1967    Years since quitting: 53.1  . Smokeless tobacco: Never Used  Substance and Sexual Activity  . Alcohol use: No  . Drug use: No  . Sexual activity: Never  Other Topics Concern  . Not on file  Social History Narrative  . Not on file   Social Determinants of Health   Financial Resource Strain: Not on file  Food Insecurity: Not on file  Transportation Needs: Not on file  Physical Activity: Not on file  Stress: Not on file  Social Connections: Not on file  Intimate Partner Violence: Not on file    Review of Systems Per HPI  Objective:   Vitals:   05/25/20 1455  BP: 138/82  Pulse: 87  Temp: 98.4 F (36.9 C)  TempSrc: Temporal  SpO2: 97%  Weight: 173 lb (78.5 kg)  Height: 5' (1.524 m)     Physical Exam Vitals reviewed.  Constitutional:      Appearance: Normal appearance. She is well-developed and well-nourished.  HENT:     Head: Normocephalic and atraumatic.  Eyes:     Extraocular  Movements: EOM normal.     Conjunctiva/sclera: Conjunctivae normal.     Pupils: Pupils are equal, round, and reactive to light.  Neck:     Vascular: No carotid bruit.     Comments: R neck prominence at tyroid, no stridor, nontender.  Cardiovascular:     Rate and Rhythm: Normal rate and regular rhythm.     Pulses: Intact distal pulses.     Heart sounds: Murmur (3-5/0 systolic. ) heard.      Comments: RRR.  Pulmonary:     Effort: Pulmonary effort is normal. No respiratory distress.     Breath sounds: Normal breath sounds. No stridor.  Abdominal:     Palpations: Abdomen is soft. There is no pulsatile mass.     Tenderness: There is no abdominal tenderness.  Skin:    General: Skin is warm and dry.  Neurological:     Mental Status: She is  alert and oriented to person, place, and time.  Psychiatric:        Mood and Affect: Mood and affect normal.        Behavior: Behavior normal.    38 minutes spent during visit, greater than 50% counseling and assimilation of information, chart review, and discussion of plan.     Assessment & Plan:  Anne Rice is a 87 y.o. female . Thyroid mass - Plan: Ambulatory referral to General Surgery  -As noted above, incidental finding in hospital.  Speaking with son, would like to meet with surgeon to discuss other components of work-up including possible percutaneous sampling, and potential treatment options, with discussion of goals of care as she is improving.  Referral placed.  Barrett's esophagus with dysplasia - Plan: Ambulatory referral to Gastroenterology  -Eating well, no complaints.  Continue PPI,refer to GI to evaluate timing of repeat imaging if needed.  Continue to avoid NSAIDs.  Dementia without behavioral disturbance, unspecified dementia type (HCC)  -Fatigue, overall sensorium improving to baseline per son.  Continue home resources, PT.  Recheck 3 months  PAF (paroxysmal atrial fibrillation) (HCC)  -Symptomatic, with previous  gastrointestinal issues, risk of falling agree with avoiding anticoagulation at this time.  Appears to be in sinus rhythm in office with regular rate/rhythm  No orders of the defined types were placed in this encounter.  Patient Instructions     Liquid tylenol option but tylenol pills as needed. Avoid any nsaids like advil, ibuprofen, alleve.   I will refer you to gastroenterology and surgeon as we discussed.   No other med changes for now. Please let me know if there are questions.   If you have lab work done today you will be contacted with your lab results within the next 2 weeks.  If you have not heard from [place] then please contact us. The fastest way to get your results is to register for My Chart.   IF you received an x-ray today, you will receive an invoice from [facility]. Please contact [facility] at [phone] with questions or concerns regarding your invoice.   IF you received labwork today, you will receive an invoice from [facility]. Please contact [facility] at [phone] with questions or concerns regarding your invoice.   Our billing staff will not be able to assist you with questions regarding bills from these companies.  You will be contacted with the lab results as soon as they are available. The fastest way to get your results is to activate your My Chart account. Instructions are located on the last page of this paperwork. If you have not heard from [place] regarding the results in 2 weeks, please contact this office.         Signed, Merri Ray, MD [facility]

## 2020-05-25 NOTE — Patient Instructions (Addendum)
   Liquid tylenol option but tylenol pills as needed. Avoid any nsaids like advil, ibuprofen, alleve.   I will refer you to gastroenterology and surgeon as we discussed.   No other med changes for now. Please let me know if there are questions.   If you have lab work done today you will be contacted with your lab results within the next 2 weeks.  If you have not heard from [place] then please contact us. The fastest way to get your results is to register for My Chart.   IF you received an x-ray today, you will receive an invoice from [facility]. Please contact [facility] at [phone] with questions or concerns regarding your invoice.   IF you received labwork today, you will receive an invoice from [facility]. Please contact [facility] at [phone] with questions or concerns regarding your invoice.   Our billing staff will not be able to assist you with questions regarding bills from these companies.  You will be contacted with the lab results as soon as they are available. The fastest way to get your results is to activate your My Chart account. Instructions are located on the last page of this paperwork. If you have not heard from [place] regarding the results in 2 weeks, please contact this office.

## 2020-05-26 DIAGNOSIS — D72829 Elevated white blood cell count, unspecified: Secondary | ICD-10-CM | POA: Diagnosis not present

## 2020-05-26 DIAGNOSIS — K2270 Barrett's esophagus without dysplasia: Secondary | ICD-10-CM | POA: Diagnosis not present

## 2020-05-26 DIAGNOSIS — K449 Diaphragmatic hernia without obstruction or gangrene: Secondary | ICD-10-CM | POA: Diagnosis not present

## 2020-07-01 DIAGNOSIS — E079 Disorder of thyroid, unspecified: Secondary | ICD-10-CM | POA: Diagnosis not present

## 2020-07-06 ENCOUNTER — Other Ambulatory Visit: Payer: Self-pay | Admitting: General Surgery

## 2020-07-06 ENCOUNTER — Other Ambulatory Visit: Payer: Self-pay | Admitting: Surgery

## 2020-07-06 DIAGNOSIS — E041 Nontoxic single thyroid nodule: Secondary | ICD-10-CM

## 2020-07-07 ENCOUNTER — Other Ambulatory Visit: Payer: Self-pay

## 2020-07-07 ENCOUNTER — Other Ambulatory Visit (HOSPITAL_COMMUNITY)
Admission: RE | Admit: 2020-07-07 | Discharge: 2020-07-07 | Disposition: A | Discharge status: Home / Self Care | Payer: Medicare Other | Source: Ambulatory Visit | Attending: General Surgery | Admitting: General Surgery

## 2020-07-07 ENCOUNTER — Ambulatory Visit
Admission: RE | Admit: 2020-07-07 | Discharge: 2020-07-07 | Disposition: A | Discharge status: Home / Self Care | Payer: Medicare Other | Source: Ambulatory Visit | Attending: General Surgery | Admitting: General Surgery

## 2020-07-07 DIAGNOSIS — E041 Nontoxic single thyroid nodule: Secondary | ICD-10-CM | POA: Diagnosis not present

## 2020-07-07 DIAGNOSIS — D440 Neoplasm of uncertain behavior of thyroid gland: Secondary | ICD-10-CM | POA: Insufficient documentation

## 2020-07-07 NOTE — Procedures (Signed)
PROCEDURE SUMMARY:  Using direct ultrasound guidance, 5 passes were made using 25 g needles into the nodule within the right lobe of the thyroid.   Ultrasound was used to confirm needle placements on all occasions.   EBL = trace  Specimens were sent to Pathology for analysis.  See procedure note under Imaging tab in Epic for full procedure details.  Lilly Gasser S Abdelaziz Westenberger PA-C 07/07/2020 1:29 PM

## 2020-07-08 LAB — CYTOLOGY - NON PAP

## 2020-07-18 ENCOUNTER — Encounter: Payer: Self-pay | Admitting: Family Medicine

## 2020-08-07 ENCOUNTER — Other Ambulatory Visit: Payer: Self-pay | Admitting: Family Medicine

## 2020-08-07 NOTE — Telephone Encounter (Signed)
Rerouted to [place]

## 2020-08-18 DIAGNOSIS — E079 Disorder of thyroid, unspecified: Secondary | ICD-10-CM | POA: Diagnosis not present

## 2020-08-19 ENCOUNTER — Other Ambulatory Visit: Payer: Self-pay | Admitting: Family Medicine

## 2020-08-19 ENCOUNTER — Other Ambulatory Visit: Payer: Self-pay

## 2020-08-19 ENCOUNTER — Telehealth: Payer: Self-pay | Admitting: Family Medicine

## 2020-08-19 MED ORDER — AMLODIPINE 1 MG/ML ORAL SUSPENSION: 2.5000 mg | Freq: Every day | ORAL | 1 refills | Status: DC

## 2020-08-19 NOTE — Telephone Encounter (Signed)
Medication sent to pharmacy  

## 2020-08-19 NOTE — Telephone Encounter (Signed)
Pt's called in stating that amlodipine on [facility] [address]

## 2020-08-22 ENCOUNTER — Other Ambulatory Visit: Payer: Self-pay

## 2020-08-22 ENCOUNTER — Encounter: Payer: Self-pay | Admitting: Family Medicine

## 2020-08-22 ENCOUNTER — Ambulatory Visit (INDEPENDENT_AMBULATORY_CARE_PROVIDER_SITE_OTHER): Payer: Medicare Other | Admitting: Family Medicine

## 2020-08-22 VITALS — BP 114/66 | HR 94 | Temp 98.3°F | Resp 16 | Ht 60.0 in | Wt 167.0 lb

## 2020-08-22 DIAGNOSIS — I10 Essential (primary) hypertension: Secondary | ICD-10-CM | POA: Diagnosis not present

## 2020-08-22 DIAGNOSIS — F0390 Unspecified dementia without behavioral disturbance: Secondary | ICD-10-CM

## 2020-08-22 DIAGNOSIS — R748 Abnormal levels of other serum enzymes: Secondary | ICD-10-CM | POA: Diagnosis not present

## 2020-08-22 DIAGNOSIS — E079 Disorder of thyroid, unspecified: Secondary | ICD-10-CM | POA: Diagnosis not present

## 2020-08-22 DIAGNOSIS — I480 Paroxysmal atrial fibrillation: Secondary | ICD-10-CM | POA: Diagnosis not present

## 2020-08-22 DIAGNOSIS — K22719 Barrett's esophagus with dysplasia, unspecified: Secondary | ICD-10-CM

## 2020-08-22 DIAGNOSIS — R739 Hyperglycemia, unspecified: Secondary | ICD-10-CM

## 2020-08-22 LAB — COMPREHENSIVE METABOLIC PANEL
ALT: 16 U/L (ref 0–35)
AST: 27 U/L (ref 0–37)
Albumin: 4 g/dL (ref 3.5–5.2)
Alkaline Phosphatase: 103 U/L (ref 39–117)
BUN: 19 mg/dL (ref 6–23)
CO2: 26 mEq/L (ref 19–32)
Calcium: 10.4 mg/dL (ref 8.4–10.5)
Chloride: 103 mEq/L (ref 96–112)
Creatinine, Ser: 0.78 mg/dL (ref 0.40–1.20)
GFR: 65.38 mL/min (ref >60.00)
Glucose, Bld: 90 mg/dL (ref 70–99)
Potassium: 4.3 mEq/L (ref 3.5–5.1)
Sodium: 138 mEq/L (ref 135–145)
Total Bilirubin: 0.4 mg/dL (ref 0.2–1.2)
Total Protein: 7.6 g/dL (ref 6.0–8.3)

## 2020-08-22 MED ORDER — AMLODIPINE BESYLATE 2.5 MG PO TABS: 2.5000 mg | ORAL_TABLET | Freq: Every day | ORAL | 2 refills | Status: DC

## 2020-08-22 MED ORDER — PANTOPRAZOLE SODIUM 40 MG PO TBEC
1.0000 | DELAYED_RELEASE_TABLET | Freq: Two times a day (BID) | ORAL | 2 refills | Status: DC
Start: 2020-08-22 — End: 2021-02-17

## 2020-08-22 NOTE — Telephone Encounter (Signed)
allternative requested by pharmacy

## 2020-08-22 NOTE — Patient Instructions (Addendum)
Call general surgeon to discuss next step for thyroid mass.  No med changes today. Take care!

## 2020-08-22 NOTE — Progress Notes (Signed)
Subjective:  Patient ID: Anne Rice, female    DOB: 1925-04-20  Age: 87 y.o. MRN: [id number]  CC:  Chief Complaint  Patient presents with  . Hospitalization Follow-up    Pt was recently in hospital for Hyatial hernia and esophagitis, pt has been doing well since that time. Reports December 26th 2021 was hospitalization was recommended to start amlodipine suspension BP good today.   . Form Completion    Pt also requests handicapped place form as original form was lost    HPI Anne Rice presents for   Esophagitis with Barrett's esophagus Continue on Protonix twice daily at her February visit.  Had made some changes in eating and sitting up after eating, elevated head of bed. Still taking BID PPI  - doing well.   Dementia See last visit.  Living with son with some help from grandson.  Previous home care/PT.  Was doing well in February.  Handicap placard needed.  Son felt like her dementia  at baseline last visit. Here with her today again. Appetite is good, doing exercises. Trouble walking long distances.   Thyroid mass 4.4 cm mass noted in December 2021. Initially deferred eval in hospital, then referred to general surgery last visit.  Saw Psychologist, sport and exercise. Had FNA 07/07/20 - insufficient fir diagnosis, but scant follicular epithelial epithelium. Plans to call Dr. Rosendo Gros to discuss next step.   Atrial fibrillation, htn New onset during hospitalization, decided against anticoagulation due to fall risk, bleeding risk. Takes amlodipine 2.5mg  qd.   No c/o heart palpitations. No chest pain. No dizziness.  BP Readings from Last 3 Encounters:  08/22/20 114/66  05/25/20 138/82  04/29/20 122/77   Lab Results  Component Value Date   CREATININE 0.60 04/29/2020      Hyperglycemia, elevated alk phos - gluc 115, AP 162 in January.   Tylenol for shoulder pain works well, not needing every day.    History Patient Active Problem List   Diagnosis Date Noted  . Hypercalcemia 04/18/2020   . Murmur 12/06/2015  . Type 2 diabetes mellitus ([place]) 01/11/2014  . Hyperlipidemia LDL goal <100 01/11/2014  . Obesity, unspecified 01/11/2014  . Basal cell carcinoma of skin 09/04/2013  . HTN (hypertension) 05/18/2013  . Dementia ([place]) 05/18/2013   Past Medical History:  Diagnosis Date  . Depression   . Murmur 12/06/2015   Past Surgical History:  Procedure Laterality Date  . ABDOMINAL HYSTERECTOMY    . APPENDECTOMY    . BIOPSY  04/19/2020   Procedure: BIOPSY;  Surgeon: Ronald Lobo, MD;  Location: [facility];  Service: Endoscopy;;  . ESOPHAGOGASTRODUODENOSCOPY N/A 04/19/2020   Procedure: ESOPHAGOGASTRODUODENOSCOPY (EGD);  Surgeon: Ronald Lobo, MD;  Location: [facility];  Service: Endoscopy;  Laterality: N/A;   No Known Allergies Prior to Admission medications   Medication Sig Start Date End Date Taking? Authorizing Provider  amLODIPine (KATERZIA) 1 mg/mL SUSP oral suspension Take 2.5 mLs (2.5 mg total) by mouth daily. 08/19/20  Yes Wendie Agreste, MD  feeding supplement (ENSURE ENLIVE / ENSURE PLUS) LIQD Take 237 mLs by mouth 2 (two) times daily between meals. 04/21/20  Yes Nita Sells, MD  lansoprazole (PREVACID) 3 mg/ml SUSP oral suspension Take 5 mLs (15 mg total) by mouth 2 (two) times daily. 04/29/20  Yes Wendie Agreste, MD  melatonin 3 MG TABS tablet Take 2 tablets (6 mg total) by mouth at bedtime as needed (sleep). 04/21/20  Yes Nita Sells, MD  Multiple Vitamin (MULTIVITAMIN WITH MINERALS) TABS tablet Take  1 tablet by mouth daily.   Yes [provider]  pantoprazole (PROTONIX) 40 MG tablet TAKE 1 TABLET BY MOUTH TWICE A DAY 08/16/20  Yes Wendie Agreste, MD   Social History   Socioeconomic History  . Marital status: Unknown    Spouse name: Not on file  . Number of children: Not on file  . Years of education: Not on file  . Highest education level: Not on file  Occupational History  . Not on file  Tobacco Use  . Smoking  status: Former Smoker    Quit date: 04/24/1967    Years since quitting: 53.3  . Smokeless tobacco: Never Used  Substance and Sexual Activity  . Alcohol use: No  . Drug use: No  . Sexual activity: Never  Other Topics Concern  . Not on file  Social History Narrative  . Not on file   Social Determinants of Health   Financial Resource Strain: Not on file  Food Insecurity: Not on file  Transportation Needs: Not on file  Physical Activity: Not on file  Stress: Not on file  Social Connections: Not on file  Intimate Partner Violence: Not on file    Review of Systems Per HPI  Objective:   Vitals:   08/22/20 0938  BP: 114/66  Pulse: 94  Resp: 16  Temp: 98.3 F (36.8 C)  TempSrc: Temporal  SpO2: 98%  Weight: 167 lb (75.8 kg)  Height: 5' (1.524 m)     Physical Exam Vitals reviewed.  Constitutional:      General: She is not in acute distress.    Appearance: She is well-developed. She is not ill-appearing.     Comments: Pleasant, responds to questions.   HENT:     Head: Normocephalic and atraumatic.  Eyes:     Conjunctiva/sclera: Conjunctivae normal.     Pupils: Pupils are equal, round, and reactive to light.  Neck:     Vascular: No carotid bruit.  Cardiovascular:     Rate and Rhythm: Normal rate and regular rhythm.     Heart sounds: Normal heart sounds.  Pulmonary:     Effort: Pulmonary effort is normal.     Breath sounds: Normal breath sounds.  Abdominal:     General: There is no distension.     Palpations: Abdomen is soft. There is no pulsatile mass.     Tenderness: There is no abdominal tenderness. There is no guarding.  Musculoskeletal:     Right lower leg: Edema (tr-1+ bilat. ) present.     Left lower leg: Edema present.  Skin:    General: Skin is warm and dry.  Neurological:     Mental Status: She is alert and oriented to person, place, and time.  Psychiatric:        Mood and Affect: Mood normal.        Behavior: Behavior normal.     Assessment &  Plan:  Anne Rice is a 87 y.o. female . Barrett's esophagus with dysplasia - Plan: pantoprazole (PROTONIX) 40 MG tablet  -  Stable, tolerating current regimen. Medications refilled. Labs pending as above.   PAF (paroxysmal atrial fibrillation) (HCC)  - sinus rhythm in office. Asx. No anticoagulation as above. Rtc/er precautions.   Thyroid mass  - insufficient bx - follow up with general surgeon.   Dementia without behavioral disturbance, unspecified dementia type ([place])  - stable.   Essential hypertension - Plan: amLODipine (NORVASC) 2.5 MG tablet  - stable. Continue same dose amlodipine.  Hyperglycemia - Plan: Comprehensive metabolic panel  Elevated alkaline phosphatase level - Plan: Comprehensive metabolic panel   Meds ordered this encounter  Medications  . amLODipine (NORVASC) 2.5 MG tablet    Sig: Take 1 tablet (2.5 mg total) by mouth daily.    Dispense:  90 tablet    Refill:  2  . pantoprazole (PROTONIX) 40 MG tablet    Sig: Take 1 tablet (40 mg total) by mouth 2 (two) times daily.    Dispense:  180 tablet    Refill:  2   Patient Instructions  Call general surgeon to discuss next step for thyroid mass.      Signed, Merri Ray, MD [facility]

## 2021-02-20 ENCOUNTER — Encounter: Payer: Self-pay | Admitting: Family Medicine

## 2021-02-20 ENCOUNTER — Other Ambulatory Visit: Payer: Self-pay

## 2021-02-20 ENCOUNTER — Ambulatory Visit (INDEPENDENT_AMBULATORY_CARE_PROVIDER_SITE_OTHER): Payer: Medicare Other | Admitting: Family Medicine

## 2021-02-20 VITALS — BP 110/60 | HR 70 | Temp 97.9°F | Resp 16 | Ht 60.0 in | Wt 159.6 lb

## 2021-02-20 DIAGNOSIS — K22719 Barrett's esophagus with dysplasia, unspecified: Secondary | ICD-10-CM

## 2021-02-20 DIAGNOSIS — I10 Essential (primary) hypertension: Secondary | ICD-10-CM | POA: Diagnosis not present

## 2021-02-20 DIAGNOSIS — I480 Paroxysmal atrial fibrillation: Secondary | ICD-10-CM | POA: Diagnosis not present

## 2021-02-20 DIAGNOSIS — R739 Hyperglycemia, unspecified: Secondary | ICD-10-CM

## 2021-02-20 DIAGNOSIS — E1169 Type 2 diabetes mellitus with other specified complication: Secondary | ICD-10-CM

## 2021-02-20 MED ORDER — PANTOPRAZOLE SODIUM 40 MG PO TBEC: 40.0000 mg | DELAYED_RELEASE_TABLET | Freq: Two times a day (BID) | ORAL | 2 refills | Status: DC

## 2021-02-20 MED ORDER — AMLODIPINE BESYLATE 2.5 MG PO TABS: 2.5000 mg | ORAL_TABLET | Freq: Every day | ORAL | 2 refills | Status: DC

## 2021-02-20 NOTE — Patient Instructions (Addendum)
  Continue to chop up food into small pieces, but I will also refer you to gastroenterology to decide on further testing.  Swallow study may also be needed but we will start with gastroenterology eval.  No med changes for today.  Thank you for commending let me know if there are questions   If you have lab work done today you will be contacted with your lab results within the next 2 weeks.  If you have not heard from [place] then please contact us. The fastest way to get your results is to register for My Chart.   IF you received an x-ray today, you will receive an invoice from [facility]. Please contact [facility] at [phone] with questions or concerns regarding your invoice.   IF you received labwork today, you will receive an invoice from [facility]. Please contact [facility] at [phone] with questions or concerns regarding your invoice.   Our billing staff will not be able to assist you with questions regarding bills from these companies.  You will be contacted with the lab results as soon as they are available. The fastest way to get your results is to activate your My Chart account. Instructions are located on the last page of this paperwork. If you have not heard from [place] regarding the results in 2 weeks, please contact this office.

## 2021-02-20 NOTE — Progress Notes (Signed)
Subjective:  Patient ID: Anne Rice, female    DOB: 04/29/1924  Age: 87 y.o. MRN: [id number]  CC:  Chief Complaint  Patient presents with   Follow-up    Patient is here for 6 month follow up. Pt son said she is hving a difficult Time swallowing , but he is currently cutting food up.    HPI ANEYAH LORTZ presents for   Barrett's esophagus with dysplasia History of esophagitis.  Last discussed in May, was continued on Protonix twice daily at that time.  Was improving at that time with changes in her eating and sitting up after eating, as well as elevating head of bed.  Reports some difficulty with swallowing recently, son who is present has been cutting food up smaller. Feels like cutting up food does well. Careful with carry out.   Dementia Lives with son with some help and grandson. Things are going well since last visit.   Previous home care/PT.  Difficulty walking long distances but able to ambulate. Some assistance needed in the shower.  Mental status has been stable.  Denies acute needs at home at this time.  Atrial fibrillation New onset during most recent hospitalization.  Decided against anticoagulation due to fall and bleeding risk.  She is on amlodipine 2.5 mg daily for hypertension.  Prior hyperglycemia of 115 in January normalized in May at 90.  Previous alk phos elevation of 162 also normalized in May. No palpitations, dyspnea.  Occasional tylenol works ok with pain.   Had flu vaccine at pharmacy.   History Patient Active Problem List   Diagnosis Date Noted   Hypercalcemia 04/18/2020   Murmur 12/06/2015   Type 2 diabetes mellitus (HCC) 01/11/2014   Hyperlipidemia LDL goal <100 01/11/2014   Obesity, unspecified 01/11/2014   Basal cell carcinoma of skin 09/04/2013   HTN (hypertension) 05/18/2013   Dementia (HCC) 05/18/2013   Past Medical History:  Diagnosis Date   Depression    Murmur 12/06/2015   Past Surgical History:  Procedure Laterality Date    ABDOMINAL HYSTERECTOMY     APPENDECTOMY     BIOPSY  04/19/2020   Procedure: BIOPSY;  Surgeon: Bernette Redbird, MD;  Location: [facility];  Service: Endoscopy;;   ESOPHAGOGASTRODUODENOSCOPY N/A 04/19/2020   Procedure: ESOPHAGOGASTRODUODENOSCOPY (EGD);  Surgeon: Bernette Redbird, MD;  Location: [facility];  Service: Endoscopy;  Laterality: N/A;   No Known Allergies Prior to Admission medications   Medication Sig Start Date End Date Taking? Authorizing Provider  amLODipine (NORVASC) 2.5 MG tablet Take 1 tablet (2.5 mg total) by mouth daily. 08/22/20  Yes Shade Flood, MD  feeding supplement (ENSURE ENLIVE / ENSURE PLUS) LIQD Take 237 mLs by mouth 2 (two) times daily between meals. 04/21/20  Yes Rhetta Mura, MD  melatonin 3 MG TABS tablet Take 2 tablets (6 mg total) by mouth at bedtime as needed (sleep). 04/21/20  Yes Rhetta Mura, MD  Multiple Vitamin (MULTIVITAMIN WITH MINERALS) TABS tablet Take 1 tablet by mouth daily.   Yes [provider]  pantoprazole (PROTONIX) 40 MG tablet Take 1 tablet (40 mg total) by mouth 2 (two) times daily. 08/22/20  Yes Shade Flood, MD   Social History   Socioeconomic History   Marital status: Unknown    Spouse name: Not on file   Number of children: Not on file   Years of education: Not on file   Highest education level: Not on file  Occupational History   Not on file  Tobacco  Use   Smoking status: Former    Types: Cigarettes    Quit date: 04/24/1967    Years since quitting: 53.8   Smokeless tobacco: Never  Substance and Sexual Activity   Alcohol use: No   Drug use: No   Sexual activity: Never  Other Topics Concern   Not on file  Social History Narrative   Not on file   Social Determinants of Health   Financial Resource Strain: Not on file  Food Insecurity: Not on file  Transportation Needs: Not on file  Physical Activity: Not on file  Stress: Not on file  Social Connections: Not on file  Intimate Partner  Violence: Not on file    Review of Systems  Constitutional:  Negative for fatigue.  Respiratory:  Negative for chest tightness and shortness of breath.   Cardiovascular:  Negative for chest pain, palpitations and leg swelling.  Gastrointestinal:  Negative for abdominal pain and blood in stool.  Neurological:  Negative for dizziness, syncope, light-headedness and headaches.    Objective:  There were no vitals filed for this visit.   Physical Exam Vitals reviewed.  Constitutional:      Appearance: Normal appearance. She is well-developed.  HENT:     Head: Normocephalic and atraumatic.  Eyes:     Conjunctiva/sclera: Conjunctivae normal.     Pupils: Pupils are equal, round, and reactive to light.  Neck:     Vascular: No carotid bruit.  Cardiovascular:     Rate and Rhythm: Normal rate and regular rhythm.     Heart sounds: Murmur (2/6 systolic. reg rhythm.) heard.  Pulmonary:     Effort: Pulmonary effort is normal.     Breath sounds: Normal breath sounds.  Abdominal:     Palpations: Abdomen is soft. There is no pulsatile mass.     Tenderness: There is no abdominal tenderness.  Musculoskeletal:     Right lower leg: No edema.     Left lower leg: No edema.  Skin:    General: Skin is warm and dry.  Neurological:     Mental Status: She is alert and oriented to person, place, and time.  Psychiatric:        Mood and Affect: Mood normal.        Behavior: Behavior normal.       Assessment & Plan:  MATAYAH REYBURN is a 87 y.o. female . Essential hypertension - Plan: amLODipine (NORVASC) 2.5 MG tablet  -  Stable, tolerating current regimen. Medications refilled.   Barrett's esophagus with dysplasia - Plan: pantoprazole (PROTONIX) 40 MG tablet, Ambulatory referral to Gastroenterology  -With episodic choking, continue to cut up food into small pieces, discussed obstructed airway treatment with his son and recommended CPR classes.  Will refer to gastroenterology, consider swallow  study but will have GI eval first.  Continue PPI twice daily.  PAF (paroxysmal atrial fibrillation) (HCC)  -Asymptomatic, regular rhythm in office today.  No orders of the defined types were placed in this encounter.  Patient Instructions   Continue to chop up food into small pieces, but I will also refer you to gastroenterology to decide on further testing.  Swallow study may also be needed but we will start with gastroenterology eval.  No med changes for today.  Thank you for commending let me know if there are questions   If you have lab work done today you will be contacted with your lab results within the next 2 weeks.  If you have not heard  from [place] then please contact us. The fastest way to get your results is to register for My Chart.   IF you received an x-ray today, you will receive an invoice from [facility]. Please contact [facility] at [phone] with questions or concerns regarding your invoice.   IF you received labwork today, you will receive an invoice from [facility]. Please contact [facility] at [phone] with questions or concerns regarding your invoice.   Our billing staff will not be able to assist you with questions regarding bills from these companies.  You will be contacted with the lab results as soon as they are available. The fastest way to get your results is to activate your My Chart account. Instructions are located on the last page of this paperwork. If you have not heard from [place] regarding the results in 2 weeks, please contact this office.       Signed,   Merri Ray, MD [facility] 02/20/21 8:48 AM

## 2021-06-08 ENCOUNTER — Telehealth: Payer: Self-pay

## 2021-06-08 NOTE — Telephone Encounter (Signed)
Message left for son to schedule palliative follow up visit

## 2021-10-10 ENCOUNTER — Telehealth: Payer: Self-pay | Admitting: Family Medicine

## 2021-10-10 NOTE — Telephone Encounter (Signed)
Left message for patient to call back and schedule Medicare Annual Wellness Visit (AWV). Please offer to do virtually or by telephone.  Left office number and my jabber #[phone]. ? ?Due for AWVI ? ?Please schedule at anytime with Nurse Health Advisor. ?  ?

## 2021-10-26 ENCOUNTER — Ambulatory Visit (INDEPENDENT_AMBULATORY_CARE_PROVIDER_SITE_OTHER): Payer: Medicare Other

## 2021-10-26 DIAGNOSIS — Z0000 Encounter for general adult medical examination without abnormal findings: Secondary | ICD-10-CM | POA: Diagnosis not present

## 2021-10-26 DIAGNOSIS — Z780 Asymptomatic menopausal state: Secondary | ICD-10-CM | POA: Diagnosis not present

## 2021-10-26 NOTE — Progress Notes (Signed)
Subjective:   Anne Rice is a 88 y.o. female who presents for an Initial Medicare Annual Wellness Visit.   I connected with Anne Rice  today by telephone and verified that I am speaking with the correct person using two identifiers. Location patient: home Location provider: work Persons participating in the virtual visit: patient, provider.   I discussed the limitations, risks, security and privacy concerns of performing an evaluation and management service by telephone and the availability of in person appointments. I also discussed with the patient that there may be a patient responsible charge related to this service. The patient expressed understanding and verbally consented to this telephonic visit.    Interactive audio and video telecommunications were attempted between this provider and patient, however failed, due to patient having technical difficulties OR patient did not have access to video capability.  We continued and completed visit with audio only.    Review of Systems     Cardiac Risk Factors include: advanced age (>66mn, >>45women)     Objective:    Today's Vitals   There is no height or weight on file to calculate BMI.     10/26/2021    3:10 PM 04/18/2020    4:50 PM 04/18/2020   10:23 AM  Advanced Directives  Does Patient Have a Medical Advance Directive? Yes Yes No  Type of [place] will Out of facility DNR (pink MOST or yellow form);[place] will   Does patient want to make changes to medical advance directive?  No - Patient declined   Copy of [place]? No - copy requested Yes - validated most recent copy scanned in chart (See row information)   Would patient like information on creating a medical advance directive?  No - Patient declined No - Patient declined    Current Medications (verified) Outpatient Encounter Medications as of 10/26/2021   Medication Sig   acetaminophen (TYLENOL) 325 MG tablet Take 650 mg by mouth every 6 (six) hours as needed.   amLODipine (NORVASC) 2.5 MG tablet Take 1 tablet (2.5 mg total) by mouth daily.   melatonin 3 MG TABS tablet Take 2 tablets (6 mg total) by mouth at bedtime as needed (sleep).   Multiple Vitamin (MULTIVITAMIN WITH MINERALS) TABS tablet Take 1 tablet by mouth daily.   pantoprazole (PROTONIX) 40 MG tablet Take 1 tablet (40 mg total) by mouth 2 (two) times daily.   feeding supplement (ENSURE ENLIVE / ENSURE PLUS) LIQD Take 237 mLs by mouth 2 (two) times daily between meals. (Patient not taking: Reported on 10/26/2021)   No facility-administered encounter medications on file as of 10/26/2021.    Allergies (verified) Patient has no known allergies.   History: Past Medical History:  Diagnosis Date   Depression    Murmur 12/06/2015   Past Surgical History:  Procedure Laterality Date   ABDOMINAL HYSTERECTOMY     APPENDECTOMY     BIOPSY  04/19/2020   Procedure: BIOPSY;  Surgeon: BRonald Lobo MD;  Location: [facility];  Service: Endoscopy;;   ESOPHAGOGASTRODUODENOSCOPY N/A 04/19/2020   Procedure: ESOPHAGOGASTRODUODENOSCOPY (EGD);  Surgeon: BRonald Lobo MD;  [facility];  Service: Endoscopy;  Laterality: N/A;   Family History  Problem Relation Age of Onset   Parkinson's disease Father    Dementia Brother    Social History   Socioeconomic History   Marital status: Unknown    Spouse name: Not on file   Number  of children: Not on file   Years of education: Not on file   Highest education level: Not on file  Occupational History   Not on file  Tobacco Use   Smoking status: Former    Types: Cigarettes    Quit date: 04/24/1967    Years since quitting: 54.5   Smokeless tobacco: Never  Substance and Sexual Activity   Alcohol use: No   Drug use: No   Sexual activity: Never  Other Topics Concern   Not on file  Social History Narrative   Not on file    Social Determinants of Health   Financial Resource Strain: Low Risk  (10/26/2021)   Overall Financial Resource Strain (CARDIA)    Difficulty of Paying Living Expenses: Not hard at all  Food Insecurity: No Food Insecurity (10/26/2021)   Hunger Vital Sign    Worried About Running Out of Food in the Last Year: Never true    [place] in the Last Year: Never true  Transportation Needs: No Transportation Needs (10/26/2021)   PRAPARE - Hydrologist (Medical): No    Lack of Transportation (Non-Medical): No  Physical Activity: Insufficiently Active (10/26/2021)   Exercise Vital Sign    Days of Exercise per Week: 7 days    Minutes of Exercise per Session: 20 min  Stress: No Stress Concern Present (10/26/2021)   [place]    Feeling of Stress : Not at all  Social Connections: Moderately Integrated (10/26/2021)   Social Connection and Isolation Panel [NHANES]    Frequency of Communication with Friends and Family: Three times a week    Frequency of Social Gatherings with Friends and Family: Three times a week    Attends Religious Services: More than 4 times per year    Active Member of Clubs or Organizations: Yes    Attends Archivist Meetings: 1 to 4 times per year    Marital Status: Divorced    Tobacco Counseling Counseling given: Not Answered   Clinical Intake:  Pre-visit preparation completed: Yes  Pain : No/denies pain     Nutritional Risks: None  How often do you need to have someone help you when you read instructions, pamphlets, or other written materials from your doctor or pharmacy?: 1 - Never What is the last grade level you completed in school?: BA  Diabetic?no   Interpreter Needed?: No  Information entered by :: [place]   Activities of Daily Living    10/26/2021    3:20 PM  In your present state of health, do you have any difficulty performing the  following activities:  Hearing? 0  Vision? 0  Difficulty concentrating or making decisions? 0  Walking or climbing stairs? 0  Dressing or bathing? 0  Doing errands, shopping? 0  Preparing Food and eating ? N  Using the Toilet? N  In the past six months, have you accidently leaked urine? N  Do you have problems with loss of bowel control? N  Managing your Medications? N  Managing your Finances? N  Housekeeping or managing your Housekeeping? N    Patient Care Team: Wendie Agreste, MD as PCP - General (Family Medicine)  Indicate any recent Medical Services you may have received from other than [facility] providers in the past year (date may be approximate).     Assessment:   This is a routine wellness examination for Deirdra.  Hearing/Vision screen Vision Screening -  Comments:: Annual eye exams   Dietary issues and exercise activities discussed: Current Exercise Habits: Home exercise routine, Time (Minutes): 20, Frequency (Times/Week): 7, Weekly Exercise (Minutes/Week): 140, Intensity: Mild, Exercise limited by: None identified   Goals Addressed   None    Depression Screen    10/26/2021    3:12 PM 10/26/2021    3:11 PM 02/20/2021    8:46 AM 08/22/2020    9:42 AM 05/25/2020    2:59 PM 04/29/2020   11:20 AM 04/18/2020    8:54 AM  PHQ 2/9 Scores  PHQ - 2 Score 0 0 0 0 0 0 1  PHQ- 9 Score   1        Fall Risk    10/26/2021    3:12 PM 02/20/2021    8:46 AM 08/22/2020    9:41 AM 05/25/2020    2:59 PM 04/29/2020   11:20 AM  [place] in the past year? 0 0 0 0 1  Number falls in past yr: 0 0   0  Injury with Fall? 0 0   0  Risk for fall due to :  No Fall Risks     Follow up Falls evaluation completed;Education provided Falls evaluation completed Falls evaluation completed Falls evaluation completed Falls evaluation completed  Comment cane/walker        FALL RISK PREVENTION PERTAINING TO THE HOME:  Any stairs in or around the home? No  If so, are there any without handrails?  No  Home free of loose throw rugs in walkways, pet beds, electrical cords, etc? Yes  Adequate lighting in your home to reduce risk of falls? Yes   ASSISTIVE DEVICES UTILIZED TO PREVENT FALLS:  Life alert? Yes  Use of a cane, walker or w/c? Yes  Grab bars in the bathroom? Yes  Shower chair or bench in shower? Yes  Elevated toilet seat or a handicapped toilet? Yes    Cognitive Function:  Normal cognitive status assessed by telephone conversation  by this Nurse Health Advisor. No abnormalities found.        Immunizations Immunization History  Administered Date(s) Administered   Influenza, High Dose Seasonal PF 02/24/2018   Influenza,inj,Quad PF,6+ Mos 01/11/2014, 03/04/2017   Influenza-Unspecified 12/22/2012, 02/22/2016   PFIZER(Purple Top)SARS-COV-2 Vaccination 05/18/2019, 06/08/2019, 01/19/2020, 08/10/2020   Pneumococcal Conjugate-13 01/11/2014   Pneumococcal Polysaccharide-23 12/04/2018    TDAP status: Due, Education has been provided regarding the importance of this vaccine. Advised may receive this vaccine at local pharmacy or Health Dept. Aware to provide a copy of the vaccination record if obtained from local pharmacy or Health Dept. Verbalized acceptance and understanding.  Flu Vaccine status: Up to date  Pneumococcal vaccine status: Up to date  Covid-19 vaccine status: Completed vaccines  Qualifies for Shingles Vaccine? Yes   Zostavax completed No   Shingrix Completed?: No.    Education has been provided regarding the importance of this vaccine. Patient has been advised to call insurance company to determine out of pocket expense if they have not yet received this vaccine. Advised may also receive vaccine at local pharmacy or Health Dept. Verbalized acceptance and understanding.  Screening Tests Health Maintenance  Topic Date Due   TETANUS/TDAP  Never done   Zoster Vaccines- Shingrix (1 of 2) Never done   DEXA SCAN  Never done   OPHTHALMOLOGY EXAM  04/07/2016    URINE MICROALBUMIN  03/04/2018   COVID-19 Vaccine (5 - Booster for Pfizer series) 10/05/2020   HEMOGLOBIN A1C  10/17/2020   FOOT EXAM  01/13/2021   INFLUENZA VACCINE  11/21/2021   Pneumonia Vaccine 18+ Years old  Completed   HPV VACCINES  Aged Out    Health Maintenance  Health Maintenance Due  Topic Date Due   TETANUS/TDAP  Never done   Zoster Vaccines- Shingrix (1 of 2) Never done   DEXA SCAN  Never done   OPHTHALMOLOGY EXAM  04/07/2016   URINE MICROALBUMIN  03/04/2018   COVID-19 Vaccine (5 - Booster for Pfizer series) 10/05/2020   HEMOGLOBIN A1C  10/17/2020   FOOT EXAM  01/13/2021    Colorectal cancer screening: No longer required.   Mammogram status: No longer required due to age.  Bone Density status: Ordered 10/26/2021. Pt provided with contact info and advised to call to schedule appt.  Lung Cancer Screening: (Low Dose CT Chest recommended if Age 44-80 years, 30 pack-year currently smoking OR have quit w/in 15years.) does not qualify.   Lung Cancer Screening Referral: n/a  Additional Screening:  Hepatitis C Screening: does not qualify;  Vision Screening: Recommended annual ophthalmology exams for early detection of glaucoma and other disorders of the eye. Is the patient up to date with their annual eye exam?  Yes  Who is the provider or what is the name of the office in which the patient attends annual eye exams? Unknown  If pt is not established with a provider, would they like to be referred to a provider to establish care? No .   Dental Screening: Recommended annual dental exams for proper oral hygiene  Community Resource Referral / Chronic Care Management: CRR required this visit?  No   CCM required this visit?  No      Plan:     I have personally reviewed and noted the following in the patient's chart:   Medical and social history Use of alcohol, tobacco or illicit drugs  Current medications and supplements including opioid prescriptions. Patient  is not currently taking opioid prescriptions. Functional ability and status Nutritional status Physical activity Advanced directives List of other physicians Hospitalizations, surgeries, and ER visits in previous 12 months Vitals Screenings to include cognitive, depression, and falls Referrals and appointments  In addition, I have reviewed and discussed with patient certain preventive protocols, quality metrics, and best practice recommendations. A written personalized care plan for preventive services as well as general preventive health recommendations were provided to patient.     Randel Pigg, LPN   08/30/2922   Nurse Notes: none

## 2021-10-26 NOTE — Patient Instructions (Signed)
Anne Rice , Thank you for taking time to come for your Medicare Wellness Visit. I appreciate your ongoing commitment to your health goals. Please review the following plan we discussed and let me know if I can assist you in the future.   Screening recommendations/referrals: Colonoscopy: no longer required  Mammogram: no longer required  Bone Density: referral 10/26/2021 Recommended yearly ophthalmology/optometry visit for glaucoma screening and checkup Recommended yearly dental visit for hygiene and checkup  Vaccinations: Influenza vaccine: completed  Pneumococcal vaccine: completed  Tdap vaccine: due  Shingles vaccine: will consider     Advanced directives: yes   Conditions/risks identified: none   Next appointment: none    Preventive Care 50 Years and Older, Female Preventive care refers to lifestyle choices and visits with your health care provider that can promote health and wellness. What does preventive care include? A yearly physical exam. This is also called an annual well check. Dental exams once or twice a year. Routine eye exams. Ask your health care provider how often you should have your eyes checked. Personal lifestyle choices, including: Daily care of your teeth and gums. Regular physical activity. Eating a healthy diet. Avoiding tobacco and drug use. Limiting alcohol use. Practicing safe sex. Taking low-dose aspirin every day. Taking vitamin and mineral supplements as recommended by your health care provider. What happens during an annual well check? The services and screenings done by your health care provider during your annual well check will depend on your age, overall health, lifestyle risk factors, and family history of disease. Counseling  Your health care provider may ask you questions about your: Alcohol use. Tobacco use. Drug use. Emotional well-being. Home and relationship well-being. Sexual activity. Eating habits. History of  falls. Memory and ability to understand (cognition). Work and work Statistician. Reproductive health. Screening  You may have the following tests or measurements: Height, weight, and BMI. Blood pressure. Lipid and cholesterol levels. These may be checked every 5 years, or more frequently if you are over 13 years old. Skin check. Lung cancer screening. You may have this screening every year starting at age 17 if you have a 30-pack-year history of smoking and currently smoke or have quit within the past 15 years. Fecal occult blood test (FOBT) of the stool. You may have this test every year starting at age 17. Flexible sigmoidoscopy or colonoscopy. You may have a sigmoidoscopy every 5 years or a colonoscopy every 10 years starting at age 38. Hepatitis C blood test. Hepatitis B blood test. Sexually transmitted disease (STD) testing. Diabetes screening. This is done by checking your blood sugar (glucose) after you have not eaten for a while (fasting). You may have this done every 1-3 years. Bone density scan. This is done to screen for osteoporosis. You may have this done starting at age 21. Mammogram. This may be done every 1-2 years. Talk to your health care provider about how often you should have regular mammograms. Talk with your health care provider about your test results, treatment options, and if necessary, the need for more tests. Vaccines  Your health care provider may recommend certain vaccines, such as: Influenza vaccine. This is recommended every year. Tetanus, diphtheria, and acellular pertussis (Tdap, Td) vaccine. You may need a Td booster every 10 years. Zoster vaccine. You may need this after age 49. Pneumococcal 13-valent conjugate (PCV13) vaccine. One dose is recommended after age 76. Pneumococcal polysaccharide (PPSV23) vaccine. One dose is recommended after age 14. Talk to your health care provider about which  screenings and vaccines you need and how often you need  them. This information is not intended to replace advice given to you by your health care provider. Make sure you discuss any questions you have with your health care provider. Document Released: 05/06/2015 Document Revised: 12/28/2015 Document Reviewed: 02/08/2015 Elsevier Interactive Patient Education  2017 [place] Prevention in the Home Falls can cause injuries. They can happen to people of all ages. There are many things you can do to make your home safe and to help prevent falls. What can I do on the outside of my home? Regularly fix the edges of walkways and driveways and fix any cracks. Remove anything that might make you trip as you walk through a door, such as a raised step or threshold. Trim any bushes or trees on the path to your home. Use bright outdoor lighting. Clear any walking paths of anything that might make someone trip, such as rocks or tools. Regularly check to see if handrails are loose or broken. Make sure that both sides of any steps have handrails. Any raised decks and porches should have guardrails on the edges. Have any leaves, snow, or ice cleared regularly. Use sand or salt on walking paths during winter. Clean up any spills in your garage right away. This includes oil or grease spills. What can I do in the bathroom? Use night lights. Install grab bars by the toilet and in the tub and shower. Do not use towel bars as grab bars. Use non-skid mats or decals in the tub or shower. If you need to sit down in the shower, use a plastic, non-slip stool. Keep the floor dry. Clean up any water that spills on the floor as soon as it happens. Remove soap buildup in the tub or shower regularly. Attach bath mats securely with double-sided non-slip rug tape. Do not have throw rugs and other things on the floor that can make you trip. What can I do in the bedroom? Use night lights. Make sure that you have a light by your bed that is easy to reach. Do not use  any sheets or blankets that are too big for your bed. They should not hang down onto the floor. Have a firm chair that has side arms. You can use this for support while you get dressed. Do not have throw rugs and other things on the floor that can make you trip. What can I do in the kitchen? Clean up any spills right away. Avoid walking on wet floors. Keep items that you use a lot in easy-to-reach places. If you need to reach something above you, use a strong step stool that has a grab bar. Keep electrical cords out of the way. Do not use floor polish or wax that makes floors slippery. If you must use wax, use non-skid floor wax. Do not have throw rugs and other things on the floor that can make you trip. What can I do with my stairs? Do not leave any items on the stairs. Make sure that there are handrails on both sides of the stairs and use them. Fix handrails that are broken or loose. Make sure that handrails are as long as the stairways. Check any carpeting to make sure that it is firmly attached to the stairs. Fix any carpet that is loose or worn. Avoid having throw rugs at the top or bottom of the stairs. If you do have throw rugs, attach them to the floor with carpet  tape. Make sure that you have a light switch at the top of the stairs and the bottom of the stairs. If you do not have them, ask someone to add them for you. What else can I do to help prevent falls? Wear shoes that: Do not have high heels. Have rubber bottoms. Are comfortable and fit you well. Are closed at the toe. Do not wear sandals. If you use a stepladder: Make sure that it is fully opened. Do not climb a closed stepladder. Make sure that both sides of the stepladder are locked into place. Ask someone to hold it for you, if possible. Clearly mark and make sure that you can see: Any grab bars or handrails. First and last steps. Where the edge of each step is. Use tools that help you move around (mobility aids)  if they are needed. These include: Canes. Walkers. Scooters. Crutches. Turn on the lights when you go into a dark area. Replace any light bulbs as soon as they burn out. Set up your furniture so you have a clear path. Avoid moving your furniture around. If any of your floors are uneven, fix them. If there are any pets around you, be aware of where they are. Review your medicines with your doctor. Some medicines can make you feel dizzy. This can increase your chance of falling. Ask your doctor what other things that you can do to help prevent falls. This information is not intended to replace advice given to you by your health care provider. Make sure you discuss any questions you have with your health care provider. Document Released: 02/03/2009 Document Revised: 09/15/2015 Document Reviewed: 05/14/2014 Elsevier Interactive Patient Education  2017 [employer].

## 2022-01-29 ENCOUNTER — Other Ambulatory Visit: Payer: Self-pay | Admitting: Family Medicine

## 2022-01-29 DIAGNOSIS — I10 Essential (primary) hypertension: Secondary | ICD-10-CM

## 2022-01-29 DIAGNOSIS — K22719 Barrett's esophagus with dysplasia, unspecified: Secondary | ICD-10-CM

## 2022-03-04 IMAGING — CT CT CHEST W/ CM
3 of 5 series · 15 of 36 positions shown, 18 images · IV contrast (OMNIPAQUE 300)
Comparison: None.

CLINICAL DATA: Weight loss, back and flank pain.

EXAM:
CT CHEST, ABDOMEN, AND PELVIS WITH CONTRAST
TECHNIQUE: Multidetector CT imaging of the chest, abdomen and pelvis was
performed following the standard protocol during bolus
administration of intravenous contrast.
CONTRAST:  100mL OMNIPAQUE IOHEXOL 300 MG/ML  SOLN

[Series 2: cap with · axial · 0.85mm/px · z∈[-485,-35]mm · 10 of 110 slices shown, 13 images]
[im 10/110  mediastinal]
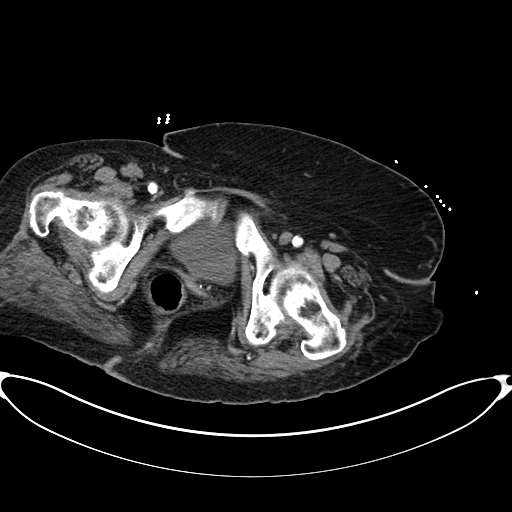
[im 10/110  lung]
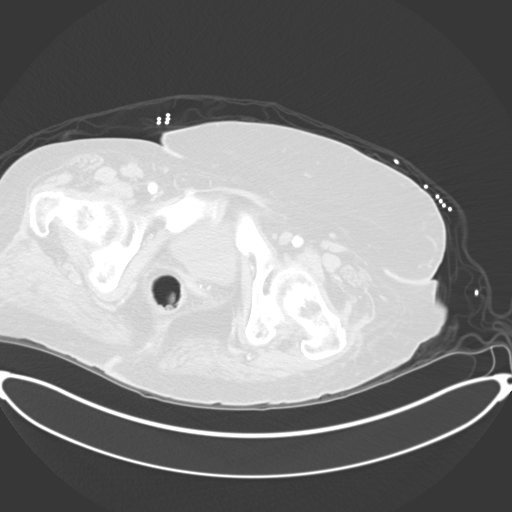
[im 20/110  lung]
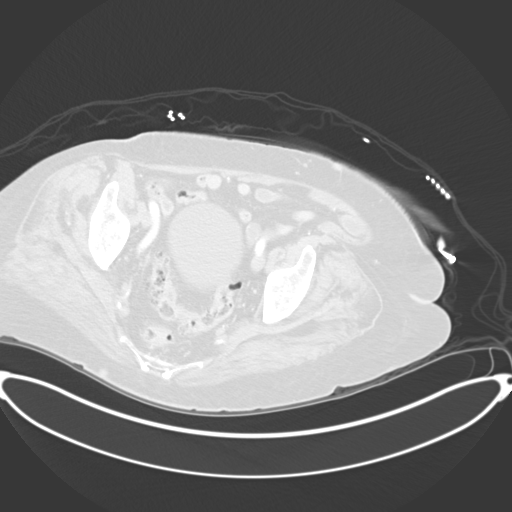
[im 30/110  lung]
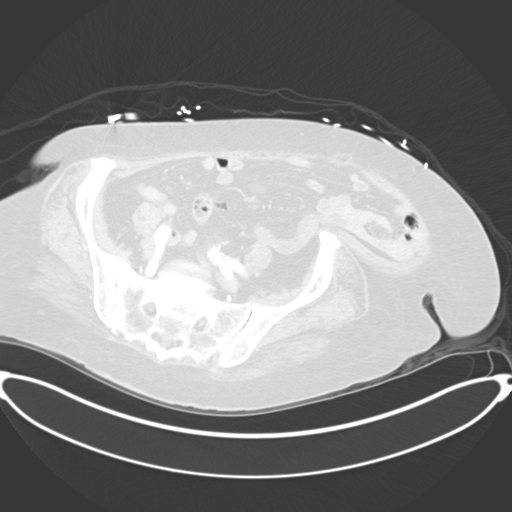
[im 40/110  lung]
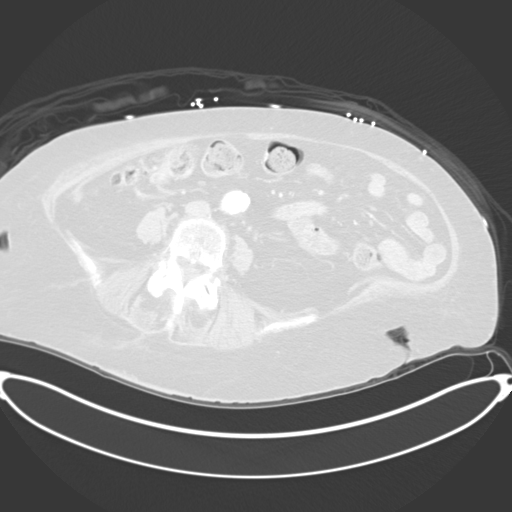
[im 50/110  mediastinal]
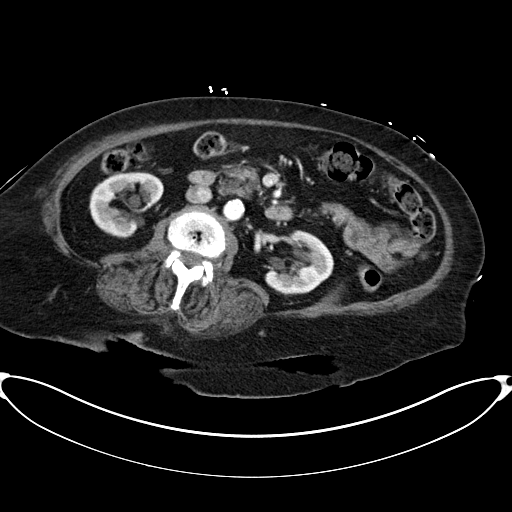
[im 50/110  lung]
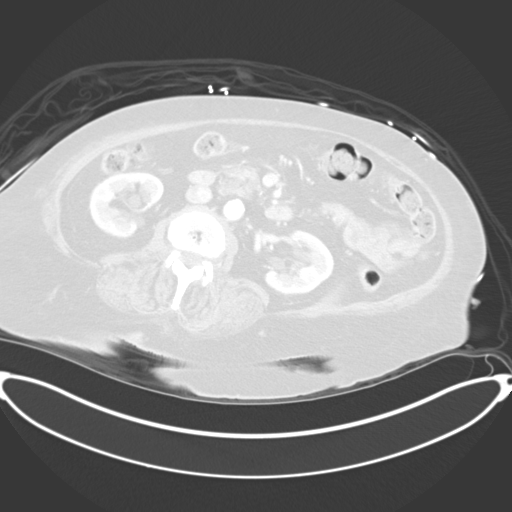
[im 60/110  lung]
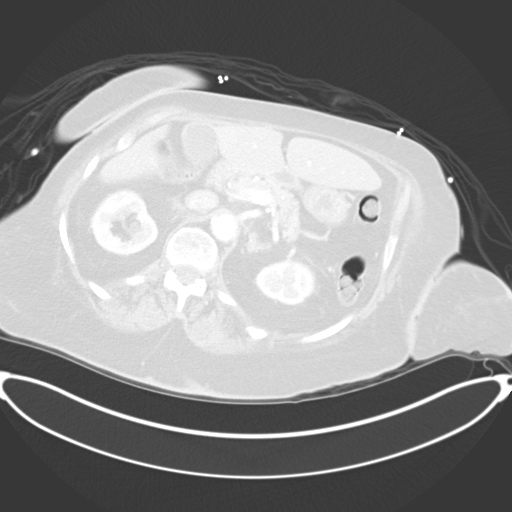
[im 70/110  lung]
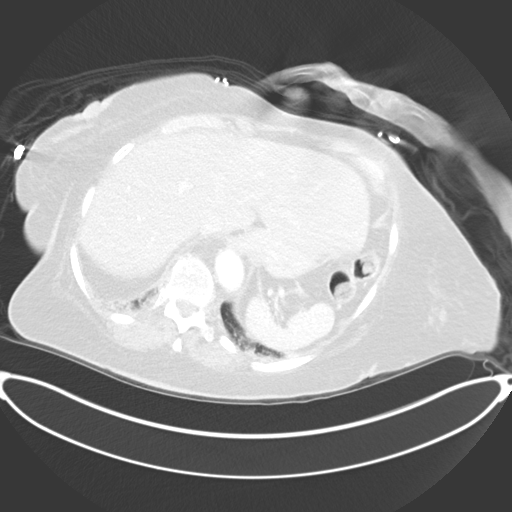
[im 80/110  lung]
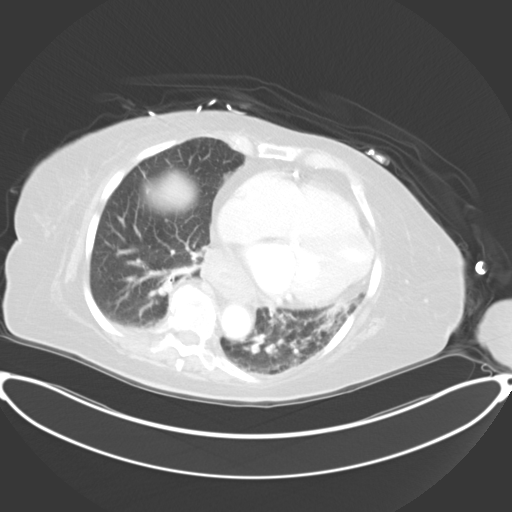
[im 90/110  mediastinal]
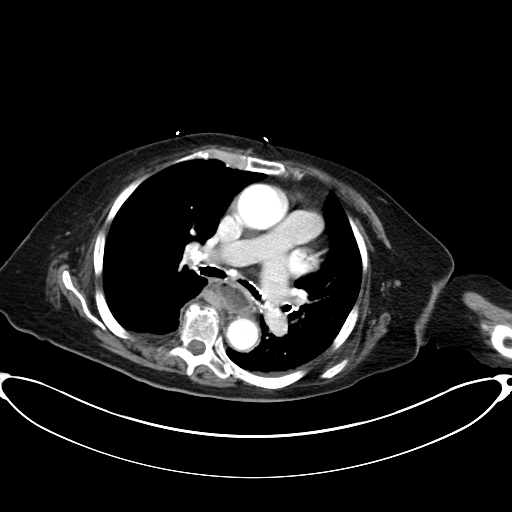
[im 90/110  lung]
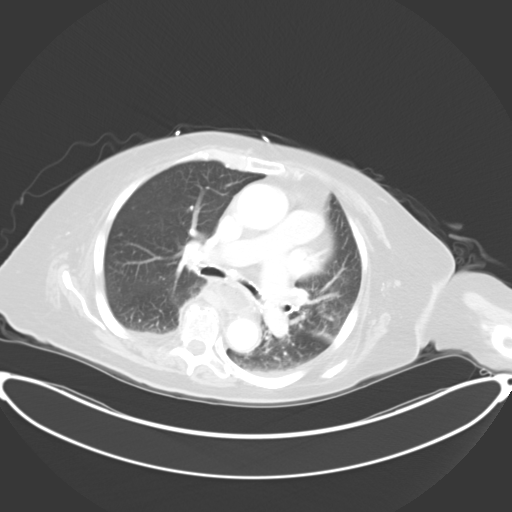
[im 100/110  lung]
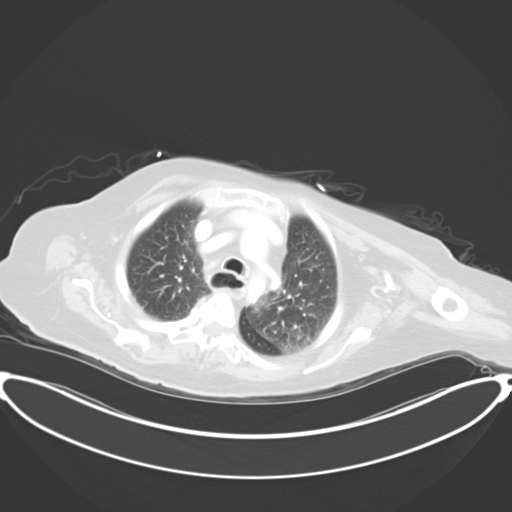

[Series 4: coronals · coronal · 0.93mm/px · 3 of 151 slices shown]
[im 31/151  lung]
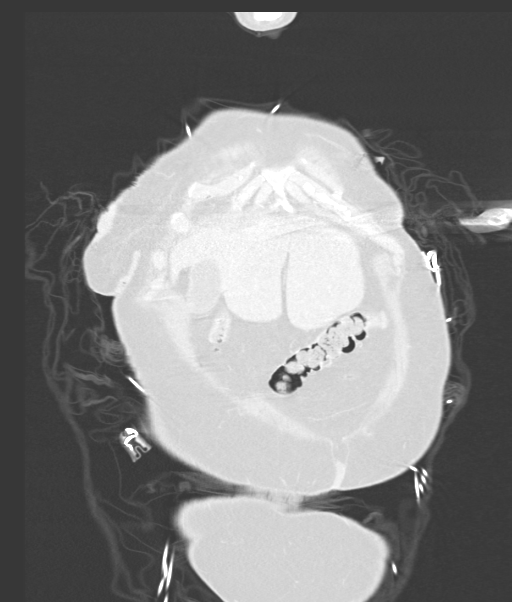
[im 61/151  lung]
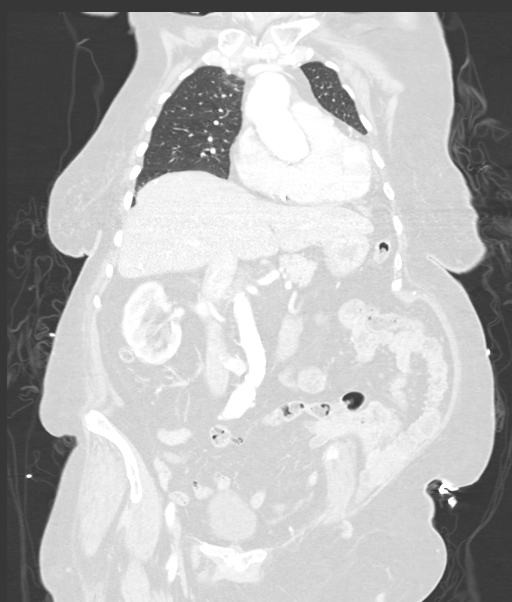
[im 91/151  lung]
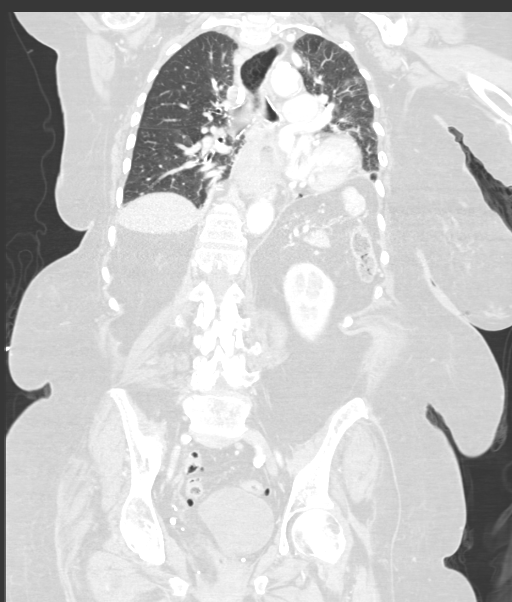

[Series 6: lung · axial · 0.85mm/px · z∈[-181,-161]mm · 2 of 108 slices shown]
[im 10/108  lung]
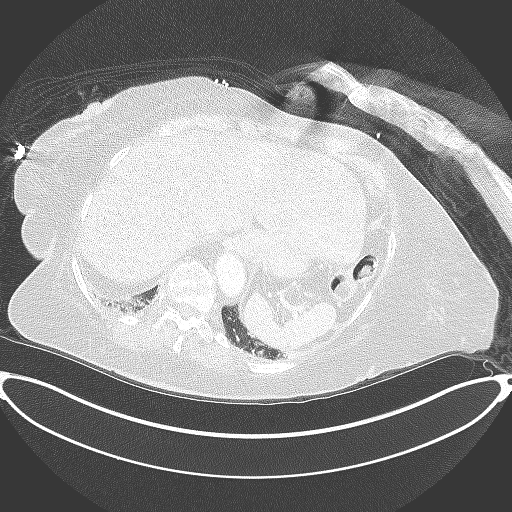
[im 20/108  lung]
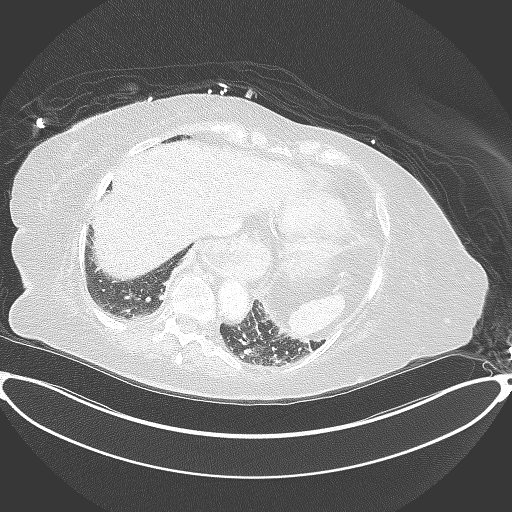

[15 of 36 positions shown; findings below may reference images not displayed]

FINDINGS: CT CHEST FINDINGS

Cardiovascular: Atherosclerosis of thoracic aorta is noted without
aneurysm or dissection. Mild cardiomegaly is noted. No pericardial
effusion is noted. Coronary artery calcifications are noted.

Mediastinum/Nodes: 4.4 cm right thyroid mass is noted. No adenopathy
is noted. The proximal esophagus is dilated. Moderate size
sliding-type hiatal hernia is noted. It is also thickened and
irregular in appearance and its distal portion concerning for
possible esophagitis or neoplasm.

Lungs/Pleura: No pneumothorax or pleural effusion is noted.
Calcified granuloma is noted in right middle lobe. Minimal bibasilar
subsegmental atelectasis is noted.

Musculoskeletal: No chest wall mass or suspicious bone lesions
identified.

CT ABDOMEN PELVIS FINDINGS

Hepatobiliary: No focal liver abnormality is seen. No gallstones,
gallbladder wall thickening, or biliary dilatation.

Pancreas: Unremarkable. No pancreatic ductal dilatation or
surrounding inflammatory changes.

Spleen: Calcified splenic granulomata are noted.

Adrenals/Urinary Tract: Adrenal glands are unremarkable. Kidneys are
normal, without renal calculi, focal lesion, or hydronephrosis.
Bladder is unremarkable.

Stomach/Bowel: There is no evidence of bowel obstruction or
inflammation. Status post appendectomy.

Vascular/Lymphatic: Aortic atherosclerosis. No enlarged abdominal or
pelvic lymph nodes.

Reproductive: Status post hysterectomy. No adnexal masses.

Other: No abdominal wall hernia or abnormality. No abdominopelvic
ascites.

Musculoskeletal: No acute or significant osseous findings.
IMPRESSION: 1. Moderate size sliding-type hiatal hernia is noted. The esophagus
is also thickened and irregular in appearance in its distal portion
concerning for possible esophagitis or neoplasm. Endoscopy is
recommended for further evaluation.
2. 4.4 cm right thyroid mass is noted. Recommend thyroid US. (Ref: [HOSPITAL]. [DATE]): 143-50).
3. Coronary artery calcifications are noted suggesting coronary
artery disease.
4. No other significant abnormality seen in the chest, abdomen or
pelvis.
5. Aortic atherosclerosis.

Aortic Atherosclerosis ([id number].E).

## 2022-04-24 ENCOUNTER — Ambulatory Visit
Admission: RE | Admit: 2022-04-24 | Discharge: 2022-04-24 | Disposition: A | Discharge status: Home / Self Care | Payer: Medicare Other | Source: Ambulatory Visit | Attending: Family Medicine | Admitting: Family Medicine

## 2022-04-24 DIAGNOSIS — Z780 Asymptomatic menopausal state: Secondary | ICD-10-CM | POA: Diagnosis not present

## 2022-04-24 DIAGNOSIS — M85852 Other specified disorders of bone density and structure, left thigh: Secondary | ICD-10-CM | POA: Diagnosis not present

## 2022-07-20 DIAGNOSIS — J1282 Pneumonia due to coronavirus disease 2019: Secondary | ICD-10-CM | POA: Insufficient documentation

## 2022-08-03 ENCOUNTER — Non-Acute Institutional Stay: Payer: Medicare Other | Admitting: Family Medicine

## 2022-08-03 DIAGNOSIS — E440 Moderate protein-calorie malnutrition: Secondary | ICD-10-CM

## 2022-08-03 DIAGNOSIS — I480 Paroxysmal atrial fibrillation: Secondary | ICD-10-CM | POA: Diagnosis not present

## 2022-08-03 DIAGNOSIS — Z86711 Personal history of pulmonary embolism: Secondary | ICD-10-CM | POA: Insufficient documentation

## 2022-08-03 DIAGNOSIS — E46 Unspecified protein-calorie malnutrition: Secondary | ICD-10-CM | POA: Insufficient documentation

## 2022-08-03 DIAGNOSIS — J1282 Pneumonia due to coronavirus disease 2019: Secondary | ICD-10-CM | POA: Diagnosis not present

## 2022-08-03 DIAGNOSIS — N1832 Chronic kidney disease, stage 3b: Secondary | ICD-10-CM | POA: Diagnosis not present

## 2022-08-03 DIAGNOSIS — M792 Neuralgia and neuritis, unspecified: Secondary | ICD-10-CM | POA: Insufficient documentation

## 2022-08-03 DIAGNOSIS — I5032 Chronic diastolic (congestive) heart failure: Secondary | ICD-10-CM | POA: Insufficient documentation

## 2022-08-03 NOTE — Progress Notes (Signed)
Therapist, nutritional Palliative Care Consult Note Telephone: [phone]  Fax: [phone]   Date of encounter: 08/03/22 1:41 PM PATIENT NAME: RENESSA Rice [address] [place] [address]   [phone] (home)  DOB: Feb 19, 1925 MRN: [id number] PRIMARY CARE PROVIDER:    Shade Flood, MD,  [address] [place] [address] [phone]  REFERRING PROVIDER:   Dr Wilhelmina Mcardle PCP at [facility] Agent/Health Care Power of Attorney:    Contact Information     Name Relation Home Work Mobile   [place] Son [phone]  [phone]        I met face to face with patient in [facility]. Palliative Care was asked to follow this patient by consultation request of Shade Flood, MD to address advance care planning and complex medical decision making. This is an initial visit.  ADVANCE CARE PLANNING/GOALS OF CARE:  Review of an advance directive document-DNR.  CODE STATUS: DNR   ASSESSMENT AND / RECOMMENDATIONS:  PPS: 50%  Chronic diastolic CHF Stable, acute phase resolved. Appears slightly on dehydrated side. Recommend daily weights. Continue Lasix 20 mg daily and Lopressor 75 mg BID. Monitor for s/sx of exacerbation   Pneumonia due to Covid 19 Resolving. Continue supportive therapy, antibiotic regimen completed. Transaminitis while inpatient has resolved with normalization of LFTs   Atrial fibrillation/hx of PE Rate controlled on Lopressor BID Anticoagulated on Eliquis, dose adjusted for age/renal function and fall risk. Encourage mobility.  4.    Moderate protein calorie malnutrition Continues on Remeron 7.5 mg QHS for appetite stimulation. Encourage small, frequent high protein density meals and snacks. Continue Ensure supplement Give meds with foods to increase caloric content  5.    CKD stage 3b Appears mildly dehydrated, continue to encourage fluid intake, may  need to consider decreasing frequency of Lasix.   Follow up Palliative Care Visit:  Palliative Care continuing to follow up by monitoring for changes in appetite, weight, functional and cognitive status for chronic disease progression and management in agreement with patient's stated goals of care. Next visit in 3-4 weeks or prn.  This visit was coded based on medical decision making (MDM).  Chief Complaint  Palliative Care received a referral to follow up for chronic medical management of chronic diastolic heart failure in setting of dementia.  HISTORY OF PRESENT ILLNESS: Anne Rice is a 89 y.o. year old female with chronic diastolic CHF in setting of dementia. Pt had acute on chronic diastolic CHF during recent hospitalization for Covid 19 pneumonia.  Denies pain, SOB, falls, nausea/vomiting, dysuria, constipation.  Appetite and mood are good. Prior to admission she was intermittently using a borrowed rollator, was having help with bathing and dressing, continent of bowel and bladder and living with her daughter [name].  Does not know why she needs to be here.  Denies DM.  Was able to say it is Friday, April but thought the year was 1964.  Pt recently had a foley and had removed for a trial of voiding.   ACTIVITIES OF DAILY LIVING: CONTINENT OF BLADDER/ BOWEL: Yes BATHING/DRESSING/FEEDING: requires assistance with bathing and dressing, independent with feeding.    MOBILITY:   INDEPENDENTLY AMBULATORY?   With limited assistance  APPETITE? good  WEIGHT: 134.64 lbs at d/c from hospital  CURRENT PROBLEM LIST:  Patient Active Problem List   Diagnosis Date Noted   Hypercalcemia 04/18/2020   Murmur 12/06/2015   Type 2 diabetes mellitus (HCC) 01/11/2014  Hyperlipidemia LDL goal <100 01/11/2014   Obesity, unspecified 01/11/2014   Basal cell carcinoma of skin 09/04/2013   HTN (hypertension) 05/18/2013   Dementia 05/18/2013   PAST MEDICAL HISTORY:  Active Ambulatory Problems     Diagnosis Date Noted   HTN (hypertension) 05/18/2013   Dementia 05/18/2013   Basal cell carcinoma of skin 09/04/2013   Type 2 diabetes mellitus (HCC) 01/11/2014   Hyperlipidemia LDL goal <100 01/11/2014   Obesity, unspecified 01/11/2014   Murmur 12/06/2015   Hypercalcemia 04/18/2020   Resolved Ambulatory Problems    Diagnosis Date Noted   No Resolved Ambulatory Problems   Past Medical History:  Diagnosis Date   Depression    SOCIAL HX:  Social History   Tobacco Use   Smoking status: Former    Types: Cigarettes    Quit date: 04/24/1967    Years since quitting: 55.3   Smokeless tobacco: Never  Substance Use Topics   Alcohol use: No   FAMILY HX:  Family History  Problem Relation Age of Onset   Parkinson's disease Father    Dementia Brother        Preferred Pharmacy: ALLERGIES: No Known Allergies   PERTINENT MEDICATIONS:  Outpatient Encounter Medications as of 08/03/2022  Medication Sig   acetaminophen (TYLENOL) 325 MG tablet Take 650 mg by mouth every 6 (six) hours as needed.   albuterol (PROVENTIL) (2.5 MG/3ML) 0.083% nebulizer solution Take 2.5 mg by nebulization every 2 (two) hours as needed for wheezing or shortness of breath.   amLODipine (NORVASC) 2.5 MG tablet TAKE 1 TABLET BY MOUTH EVERY DAY   apixaban (ELIQUIS) 2.5 MG TABS tablet Take 2.5 mg by mouth 2 (two) times daily.   atorvastatin (LIPITOR) 40 MG tablet Take 40 mg by mouth at bedtime.   bethanechol (URECHOLINE) 10 MG tablet Take 10 mg by mouth 3 (three) times daily.   Calcium Citrate 250 MG TABS Take 250 mg by mouth 2 (two) times daily.   donepezil (ARICEPT) 10 MG tablet Take 10 mg by mouth at bedtime.   DULoxetine (CYMBALTA) 30 MG capsule Take 30 mg by mouth daily.   furosemide (LASIX) 20 MG tablet Take 20 mg by mouth daily.   levothyroxine (SYNTHROID) 100 MCG tablet Take 100 mcg by mouth daily before breakfast.   metoprolol tartrate (LOPRESSOR) 25 MG tablet Take 25 mg by mouth 2 (two) times daily. Given  with 50 mg to equal 75 mg BID   metoprolol tartrate (LOPRESSOR) 50 MG tablet Take 50 mg by mouth 2 (two) times daily.   [DISCONTINUED] pantoprazole (PROTONIX) 40 MG tablet TAKE 1 TABLET BY MOUTH TWICE A DAY (Patient taking differently: Take 40 mg by mouth daily.)   feeding supplement (ENSURE ENLIVE / ENSURE PLUS) LIQD Take 237 mLs by mouth 2 (two) times daily between meals. (Patient not taking: Reported on 10/26/2021)   melatonin 3 MG TABS tablet Take 2 tablets (6 mg total) by mouth at bedtime as needed (sleep). (Patient not taking: Reported on 08/03/2022)   Multiple Vitamin (MULTIVITAMIN WITH MINERALS) TABS tablet Take 1 tablet by mouth daily. (Patient not taking: Reported on 08/03/2022)   No facility-administered encounter medications on file as of 08/03/2022.    History obtained from review of EMR, discussion with primary team, and interview with family, facility staff/caregiver and/or patient.  Facility labs 07/31/22 with [facility]: CMP remarkable for: BUN 36 Cr 1.07 eGFR 48 Ca 8.3 Albumin 3.0 AST/ALT normal at 28/26  CBC unremarkable    I reviewed available labs, medications,  imaging, studies and related documents from the EMR. Records reviewed and summarized above.   Physical Exam: GENERAL: NAD EENT:  HOH LUNGS: CTAB, slightly diminished, no increased work of breathing, slight dyspnea on resting breathing, on room air CARDIAC:  S1S2, RRR with no MRG, 2+ ankle BLE edema, No cyanosis ABD:  Normo-active BS x 4 quads, soft, non-tender EXTREMITIES: Normal ROM, no deformity, strength equal, No muscle atrophy/subcutaneous fat loss NEURO:  No weakness, noted cognitive impairment, no aphasia-expressive/receptive  PSYCH:  non-anxious affect, A & O x 2  Thank you for the opportunity to participate in the care of [facility]. Please call our main office at [phone] if we can be of additional assistance.    Joycelyn Man FNP-C  [email or web address] .[facility]  Palliative Care  Phone:  [phone]

## 2022-08-13 ENCOUNTER — Encounter: Payer: Self-pay | Admitting: Family Medicine

## 2022-08-19 DIAGNOSIS — M79605 Pain in left leg: Secondary | ICD-10-CM | POA: Diagnosis not present

## 2022-08-19 DIAGNOSIS — E872 Acidosis, unspecified: Secondary | ICD-10-CM | POA: Diagnosis not present

## 2022-08-19 DIAGNOSIS — I214 Non-ST elevation (NSTEMI) myocardial infarction: Secondary | ICD-10-CM | POA: Diagnosis not present

## 2022-08-19 DIAGNOSIS — Z66 Do not resuscitate: Secondary | ICD-10-CM | POA: Diagnosis not present

## 2022-08-19 DIAGNOSIS — W19XXXA Unspecified fall, initial encounter: Secondary | ICD-10-CM | POA: Diagnosis not present

## 2022-08-19 DIAGNOSIS — M5126 Other intervertebral disc displacement, lumbar region: Secondary | ICD-10-CM | POA: Diagnosis not present

## 2022-08-19 DIAGNOSIS — K449 Diaphragmatic hernia without obstruction or gangrene: Secondary | ICD-10-CM | POA: Diagnosis not present

## 2022-08-19 DIAGNOSIS — Z515 Encounter for palliative care: Secondary | ICD-10-CM | POA: Diagnosis not present

## 2022-08-19 DIAGNOSIS — S3993XA Unspecified injury of pelvis, initial encounter: Secondary | ICD-10-CM | POA: Diagnosis not present

## 2022-08-19 DIAGNOSIS — I350 Nonrheumatic aortic (valve) stenosis: Secondary | ICD-10-CM | POA: Diagnosis not present

## 2022-08-19 DIAGNOSIS — M47814 Spondylosis without myelopathy or radiculopathy, thoracic region: Secondary | ICD-10-CM | POA: Diagnosis not present

## 2022-08-19 DIAGNOSIS — R6889 Other general symptoms and signs: Secondary | ICD-10-CM | POA: Diagnosis not present

## 2022-08-19 DIAGNOSIS — S199XXA Unspecified injury of neck, initial encounter: Secondary | ICD-10-CM | POA: Diagnosis not present

## 2022-08-19 DIAGNOSIS — I6523 Occlusion and stenosis of bilateral carotid arteries: Secondary | ICD-10-CM | POA: Diagnosis not present

## 2022-08-19 DIAGNOSIS — S3992XA Unspecified injury of lower back, initial encounter: Secondary | ICD-10-CM | POA: Diagnosis not present

## 2022-08-19 DIAGNOSIS — I672 Cerebral atherosclerosis: Secondary | ICD-10-CM | POA: Diagnosis not present

## 2022-08-19 DIAGNOSIS — R948 Abnormal results of function studies of other organs and systems: Secondary | ICD-10-CM | POA: Diagnosis not present

## 2022-08-19 DIAGNOSIS — S299XXA Unspecified injury of thorax, initial encounter: Secondary | ICD-10-CM | POA: Diagnosis not present

## 2022-08-19 DIAGNOSIS — R222 Localized swelling, mass and lump, trunk: Secondary | ICD-10-CM | POA: Diagnosis not present

## 2022-08-19 DIAGNOSIS — S3991XA Unspecified injury of abdomen, initial encounter: Secondary | ICD-10-CM | POA: Diagnosis not present

## 2022-08-19 DIAGNOSIS — R531 Weakness: Secondary | ICD-10-CM | POA: Diagnosis not present

## 2022-08-19 DIAGNOSIS — R739 Hyperglycemia, unspecified: Secondary | ICD-10-CM | POA: Diagnosis not present

## 2022-08-19 DIAGNOSIS — S0990XA Unspecified injury of head, initial encounter: Secondary | ICD-10-CM | POA: Diagnosis not present

## 2022-08-19 DIAGNOSIS — I959 Hypotension, unspecified: Secondary | ICD-10-CM | POA: Diagnosis not present

## 2022-08-19 DIAGNOSIS — R578 Other shock: Secondary | ICD-10-CM | POA: Diagnosis not present

## 2022-08-19 DIAGNOSIS — R55 Syncope and collapse: Secondary | ICD-10-CM | POA: Diagnosis not present

## 2022-08-19 DIAGNOSIS — Z20822 Contact with and (suspected) exposure to covid-19: Secondary | ICD-10-CM | POA: Diagnosis not present

## 2022-08-19 DIAGNOSIS — I10 Essential (primary) hypertension: Secondary | ICD-10-CM | POA: Diagnosis not present

## 2022-08-19 DIAGNOSIS — M47816 Spondylosis without myelopathy or radiculopathy, lumbar region: Secondary | ICD-10-CM | POA: Diagnosis not present

## 2022-08-19 DIAGNOSIS — K219 Gastro-esophageal reflux disease without esophagitis: Secondary | ICD-10-CM | POA: Diagnosis not present

## 2022-08-19 DIAGNOSIS — E041 Nontoxic single thyroid nodule: Secondary | ICD-10-CM | POA: Diagnosis not present

## 2022-08-19 DIAGNOSIS — R404 Transient alteration of awareness: Secondary | ICD-10-CM | POA: Diagnosis not present

## 2022-08-19 DIAGNOSIS — E0789 Other specified disorders of thyroid: Secondary | ICD-10-CM | POA: Diagnosis not present

## 2022-08-19 DIAGNOSIS — I249 Acute ischemic heart disease, unspecified: Secondary | ICD-10-CM | POA: Diagnosis not present

## 2022-08-19 DIAGNOSIS — I3139 Other pericardial effusion (noninflammatory): Secondary | ICD-10-CM | POA: Diagnosis not present

## 2022-08-22 DEATH — deceased

## 2022-10-24 ENCOUNTER — Other Ambulatory Visit: Payer: Self-pay | Admitting: Family Medicine

## 2022-10-24 DIAGNOSIS — K22719 Barrett's esophagus with dysplasia, unspecified: Secondary | ICD-10-CM

## 2022-10-24 DIAGNOSIS — I10 Essential (primary) hypertension: Secondary | ICD-10-CM
